# Patient Record
Sex: Male | Born: 2000 | Race: Black or African American | Hispanic: No | Marital: Single | Smoking: Never smoker
Health system: Southern US, Community
[De-identification: ages and names within clinical notes are randomized; demographics above are authoritative.]

---
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
## 2021-05-25 ENCOUNTER — Emergency Department (HOSPITAL_BASED_OUTPATIENT_CLINIC_OR_DEPARTMENT_OTHER)
Admission: EM | Admit: 2021-05-25 | Discharge: 2021-05-25 | Disposition: A | Discharge status: Home / Self Care | Payer: BC Managed Care – PPO | Attending: Emergency Medicine | Admitting: Emergency Medicine

## 2021-05-25 ENCOUNTER — Encounter (HOSPITAL_BASED_OUTPATIENT_CLINIC_OR_DEPARTMENT_OTHER): Payer: Self-pay | Admitting: Emergency Medicine

## 2021-05-25 ENCOUNTER — Emergency Department (HOSPITAL_BASED_OUTPATIENT_CLINIC_OR_DEPARTMENT_OTHER): Payer: BC Managed Care – PPO

## 2021-05-25 DIAGNOSIS — I824Z2 Acute embolism and thrombosis of unspecified deep veins of left distal lower extremity: Secondary | ICD-10-CM | POA: Diagnosis not present

## 2021-05-25 DIAGNOSIS — M7989 Other specified soft tissue disorders: Secondary | ICD-10-CM | POA: Diagnosis not present

## 2021-05-25 DIAGNOSIS — R590 Localized enlarged lymph nodes: Secondary | ICD-10-CM | POA: Diagnosis not present

## 2021-05-25 DIAGNOSIS — R9431 Abnormal electrocardiogram [ECG] [EKG]: Secondary | ICD-10-CM | POA: Diagnosis not present

## 2021-05-25 DIAGNOSIS — I82442 Acute embolism and thrombosis of left tibial vein: Secondary | ICD-10-CM | POA: Diagnosis not present

## 2021-05-25 DIAGNOSIS — I2699 Other pulmonary embolism without acute cor pulmonale: Secondary | ICD-10-CM | POA: Diagnosis not present

## 2021-05-25 DIAGNOSIS — Z8739 Personal history of other diseases of the musculoskeletal system and connective tissue: Secondary | ICD-10-CM | POA: Diagnosis not present

## 2021-05-25 DIAGNOSIS — N62 Hypertrophy of breast: Secondary | ICD-10-CM | POA: Diagnosis not present

## 2021-05-25 DIAGNOSIS — R791 Abnormal coagulation profile: Secondary | ICD-10-CM | POA: Diagnosis not present

## 2021-05-25 DIAGNOSIS — I82402 Acute embolism and thrombosis of unspecified deep veins of left lower extremity: Secondary | ICD-10-CM | POA: Diagnosis not present

## 2021-05-25 LAB — CBC WITH DIFFERENTIAL/PLATELET
Abs Immature Granulocytes: 0.01 10*3/uL (ref 0.00–0.07)
Basophils Absolute: 0 10*3/uL (ref 0.0–0.1)
Basophils Relative: 1 %
Eosinophils Absolute: 0.1 10*3/uL (ref 0.0–0.5)
Eosinophils Relative: 1 %
HCT: 41.3 % (ref 39.0–52.0)
Hemoglobin: 14.3 g/dL (ref 13.0–17.0)
Immature Granulocytes: 0 %
Lymphocytes Relative: 48 %
Lymphs Abs: 2.7 10*3/uL (ref 0.7–4.0)
MCH: 31.4 pg (ref 26.0–34.0)
MCHC: 34.6 g/dL (ref 30.0–36.0)
MCV: 90.6 fL (ref 80.0–100.0)
Monocytes Absolute: 0.5 10*3/uL (ref 0.1–1.0)
Monocytes Relative: 9 %
Neutro Abs: 2.3 10*3/uL (ref 1.7–7.7)
Neutrophils Relative %: 41 %
Platelets: 292 10*3/uL (ref 150–400)
RBC: 4.56 MIL/uL (ref 4.22–5.81)
RDW: 11.6 % (ref 11.5–15.5)
WBC: 5.6 10*3/uL (ref 4.0–10.5)
nRBC: 0 % (ref 0.0–0.2)

## 2021-05-25 LAB — BASIC METABOLIC PANEL
Anion gap: 5 (ref 5–15)
BUN: 9 mg/dL (ref 6–20)
CO2: 26 mmol/L (ref 22–32)
Calcium: 9.1 mg/dL (ref 8.9–10.3)
Chloride: 107 mmol/L (ref 98–111)
Creatinine, Ser: 1.11 mg/dL (ref 0.61–1.24)
GFR, Estimated: >60 mL/min (ref >60)
Glucose, Bld: 92 mg/dL (ref 70–99)
Potassium: 4 mmol/L (ref 3.5–5.1)
Sodium: 138 mmol/L (ref 135–145)

## 2021-05-25 MED ORDER — APIXABAN 2.5 MG PO TABS: 10.0000 mg | ORAL_TABLET | Freq: Two times a day (BID) | ORAL | Status: DC

## 2021-05-25 MED ORDER — APIXABAN (ELIQUIS) VTE STARTER PACK (10MG AND 5MG)
ORAL_TABLET | ORAL | 0 refills | Status: DC
  Filled 2021-05-26: qty 74, 30d supply, fill #0

## 2021-05-25 MED ORDER — APIXABAN 2.5 MG PO TABS: 5.0000 mg | ORAL_TABLET | Freq: Two times a day (BID) | ORAL | Status: DC

## 2021-05-25 MED ORDER — IOHEXOL 350 MG/ML SOLN
75.0000 mL | Freq: Once | INTRAVENOUS | Status: AC | PRN
  Administered 2021-05-25: 75 mL via INTRAVENOUS

## 2021-05-25 MED ORDER — APIXABAN (ELIQUIS) EDUCATION KIT FOR DVT/PE PATIENTS
PACK | Freq: Once | Status: AC
  Administered 2021-05-25: 1

## 2021-05-25 MED ORDER — APIXABAN 2.5 MG PO TABS
10.0000 mg | ORAL_TABLET | Freq: Once | ORAL | Status: AC
  Administered 2021-05-25: 10 mg via ORAL
  Filled 2021-05-25: qty 4

## 2021-05-25 NOTE — ED Provider Notes (Signed)
?[facility] EMERGENCY DEPARTMENT ?Provider Note ? ? ?CSN: [id number] ?Arrival date & time: 05/25/21  1249 ? ?  ? ?History ? ?Chief Complaint  ?Patient presents with  ? Leg Swelling  ? ? ?Norman Nelson is a 21 y.o. male. With no significant past medical history who presents to the emergency department with leg swelling. ? ?States that for around 5 months he has had intermittent left lower extremity swelling.  He states that at most it is from the knee down.  He states that it is worse at the end of the day.  He denies any claudication.  Denies numbness or tingling to the leg.  He denies any rashes or bites or wounds.  He denies ever having injuries to this leg or surgeries.  He is a frequent traveler and the swelling does not correlate to a particular trip.  He denies any shortness of breath, palpitations.  He denies swelling to his right leg.  Denies any hematuria or abdominal swelling. ? ?HPI ? ?  ? ?Home Medications ?Prior to Admission medications   ?Medication Sig Start Date End Date Taking? Authorizing Provider  ?acetaminophen (TYLENOL) 325 MG tablet Take 2 tablets (650 mg total) by mouth every 6 (six) hours as needed. 02/19/17   Jean Rosenthal, NP  ?diphenhydrAMINE (BENADRYL) 25 MG tablet Take 1 tablet (25 mg total) by mouth every 8 (eight) hours as needed (at onset of headache). 02/19/17   Jean Rosenthal, NP  ?ibuprofen (ADVIL,MOTRIN) 800 MG tablet Take 1 tablet (800 mg total) by mouth every 8 (eight) hours as needed for headache. 02/19/17   Jean Rosenthal, NP  ?ondansetron (ZOFRAN ODT) 4 MG disintegrating tablet Take 1 tablet (4 mg total) by mouth every 8 (eight) hours as needed for nausea or vomiting. 02/19/17   Jean Rosenthal, NP  ?   ? ?Allergies    ?Patient has no known allergies.   ? ?Review of Systems   ?Review of Systems  ?Cardiovascular:  Positive for leg swelling.  ?All other systems reviewed and are negative. ? ?Physical Exam ?Updated Vital Signs ?BP 120/62 (BP  Location: Left Arm)   Pulse 69   Temp 97.9 ?F (36.6 ?C) (Oral)   Resp 18   Ht '5\' 9"'  (1.753 m)   Wt 90.7 kg   SpO2 98%   BMI 29.53 kg/m?  ?Physical Exam ?Vitals and nursing note reviewed.  ?Constitutional:   ?   General: He is not in acute distress. ?   Appearance: Normal appearance. He is normal weight. He is not ill-appearing or toxic-appearing.  ?HENT:  ?   Head: Normocephalic and atraumatic.  ?Eyes:  ?   General: No scleral icterus. ?Cardiovascular:  ?   Pulses: Normal pulses.  ?Pulmonary:  ?   Effort: Pulmonary effort is normal. No respiratory distress.  ?Musculoskeletal:     ?   General: Swelling present. No tenderness, deformity or signs of injury. Normal range of motion.  ?   Left lower leg: Edema present.  ?   Comments: Left lower extremity nonpitting edema evident to the mid calf down to the foot.  ?Skin: ?   General: Skin is warm and dry.  ?   Capillary Refill: Capillary refill takes less than 2 seconds.  ?   Findings: No bruising, erythema or rash.  ?Neurological:  ?   General: No focal deficit present.  ?   Mental Status: He is alert and oriented to person, place, and time. Mental status is at  baseline.  ?   Sensory: No sensory deficit.  ?Psychiatric:     ?   Mood and Affect: Mood normal.     ?   Behavior: Behavior normal.     ?   Thought Content: Thought content normal.     ?   Judgment: Judgment normal.  ? ? ?ED Results / Procedures / Treatments   ?Labs ?(all labs ordered are listed, but only abnormal results are displayed) ?Labs Reviewed  ?BASIC METABOLIC PANEL  ?CBC WITH DIFFERENTIAL/PLATELET  ? ?EKG ?None ? ?Radiology ?CT Angio Chest PE W/Cm &/Or Wo Cm ? ?Result Date: 05/25/2021 ?CLINICAL DATA:  Pulmonary embolism (PE) suspected, unknown D-dimer positive DVT EXAM: CT ANGIOGRAPHY CHEST WITH CONTRAST TECHNIQUE: Multidetector CT imaging of the chest was performed using the standard protocol during bolus administration of intravenous contrast. Multiplanar CT image reconstructions and MIPs were  obtained to evaluate the vascular anatomy. RADIATION DOSE REDUCTION: This exam was performed according to the departmental dose-optimization program which includes automated exposure control, adjustment of the mA and/or kV according to patient size and/or use of iterative reconstruction technique. CONTRAST:  74m OMNIPAQUE IOHEXOL 350 MG/ML SOLN COMPARISON:  None Available. FINDINGS: Cardiovascular: There are no filling defects within the pulmonary arteries to suggest pulmonary embolus. Upper normal caliber pulmonary artery at 3 cm. Normal thoracic aorta without dissection or acute aortic findings. Conventional branching pattern from the aortic arch. The heart is normal in size. There is no pericardial effusion. Mediastinum/Nodes: Shotty right hilar lymph node measuring 10 mm short axis. No mediastinal adenopathy. Triangular soft tissue density in the anterior mediastinum is typical of residual thymus, not unexpected for age. No esophageal wall thickening. No thyroid nodule. Lungs/Pleura: Clear lungs. No focal airspace disease. No pleural effusion. No features of pulmonary edema. Trachea and central bronchi are patent. Upper Abdomen: No acute or unexpected findings. Musculoskeletal: There are no acute or suspicious osseous abnormalities. Bilateral gynecomastia. Review of the MIP images confirms the above findings. IMPRESSION: 1. No pulmonary embolus or acute intrathoracic abnormality. 2. Shotty right hilar lymph node, likely reactive. 3. Bilateral gynecomastia. Electronically Signed   By: MKeith RakeM.D.   On: 05/25/2021 17:30  ? ?UK[place]Venous Img Lower  Left (DVT Study) ? ?Result Date: 05/25/2021 ?CLINICAL DATA:  21year old male with a history of leg swelling EXAM: LEFT LOWER EXTREMITY VENOUS DOPPLER ULTRASOUND TECHNIQUE: Gray-scale sonography with graded compression, as well as color Doppler and duplex ultrasound were performed to evaluate the lower extremity deep venous systems from the level of the common  femoral vein and including the common femoral, femoral, profunda femoral, popliteal and calf veins including the posterior tibial, peroneal and gastrocnemius veins when visible. The superficial great saphenous vein was also interrogated. Spectral Doppler was utilized to evaluate flow at rest and with distal augmentation maneuvers in the common femoral, femoral and popliteal veins. COMPARISON:  None Available. FINDINGS: Contralateral Common Femoral Vein: Respiratory phasicity is normal and symmetric with the symptomatic side. No evidence of thrombus. Normal compressibility. Common Femoral Vein: No evidence of thrombus. Normal compressibility, respiratory phasicity and response to augmentation. Saphenofemoral Junction: No evidence of thrombus. Normal compressibility and flow on color Doppler imaging. Profunda Femoral Vein: No evidence of thrombus. Normal compressibility and flow on color Doppler imaging. Femoral Vein: No evidence of thrombus. Normal compressibility, respiratory phasicity and response to augmentation. Popliteal Vein: No evidence of thrombus. Normal compressibility, respiratory phasicity and response to augmentation. Calf Veins: Occlusive thrombus of the posterior tibial vein and peroneal vein which are  noncompressible. No extension to the popliteal vein. Superficial Great Saphenous Vein: No evidence of thrombus. Normal compressibility and flow on color Doppler imaging. Other Findings:  None. IMPRESSION: Directed duplex left lower extremity positive for DVT of the tibial veins involving posterior tibial vein and peroneal vein, with no proximal extension into the popliteal vein. These results were discussed by telephone at the time of interpretation on 05/25/2021 at 3:55 pm with Dr. Theodis Blaze Electronically Signed   By: Corrie Mckusick D.O.   On: 05/25/2021 15:56   ? ?Procedures ?Procedures  ? ?Medications Ordered in ED ?Medications  ?apixaban [facility]) Education Kit for DVT/PE patients (has no  administration in time range)  ?apixaban (ELIQUIS) tablet 10 mg (has no administration in time range)  ?iohexol (OMNIPAQUE) 350 MG/ML injection 75 mL (75 mLs Intravenous Contrast Given 05/25/21 1705)  ? ? ?ED Course/ Medical [facility] Lasso

## 2021-05-25 NOTE — Discharge Instructions (Addendum)
You were seen in the emergency department today for right lower extremity swelling.  You do have a deep vein thrombosis or DVT in your leg.  You do not have a clot in your lungs.  We are starting you on a blood thinner called Eliquis.  You will take this as prescribed on the starter pack.  I have also placed a referral to hematology for you to be seen for an ongoing work-up of why you have this clot in your leg.  Please return to the emergency department if you begin to have shortness of breath, chest pain or difficulty breathing.  Please avoid contact sports.  Please do not take NSAIDs such as ibuprofen, naproxen, diclofenac, meloxicam, Motrin, Advil as these interact with the blood thinner. ? ?Information on my medicine - ELIQUIS? (apixaban) ? ?This medication education was reviewed with me or my healthcare representative as part of my discharge preparation.  ? ?Why was Eliquis? prescribed for you? ?Eliquis? was prescribed to treat blood clots that may have been found in the veins of your legs (deep vein thrombosis) or in your lungs (pulmonary embolism) and to reduce the risk of them occurring again. ? ?What do You need to know about Eliquis? ? ?The starting dose is 10 mg (two 5 mg tablets) taken TWICE daily for the FIRST SEVEN (7) DAYS, then on (enter date)  06/01/2021  the dose is reduced to ONE 5 mg tablet taken TWICE daily.  Eliquis? may be taken with or without food.  ? ?Try to take the dose about the same time in the morning and in the evening. If you have difficulty swallowing the tablet whole please discuss with your pharmacist how to take the medication safely. ? ?Take Eliquis? exactly as prescribed and DO NOT stop taking Eliquis? without talking to the doctor who prescribed the medication.  Stopping may increase your risk of developing a new blood clot.  Refill your prescription before you run out. ? ?After discharge, you should have regular check-up appointments with your healthcare provider that is  prescribing your Eliquis?. ?   ?What do you do if you miss a dose? ?If a dose of ELIQUIS? is not taken at the scheduled time, take it as soon as possible on the same day and twice-daily administration should be resumed. The dose should not be doubled to make up for a missed dose. ? ?Important Safety Information ?A possible side effect of Eliquis? is bleeding. You should call your healthcare provider right away if you experience any of the following: ?Bleeding from an injury or your nose that does not stop. ?Unusual colored urine (red or dark brown) or unusual colored stools (red or black). ?Unusual bruising for unknown reasons. ?A serious fall or if you hit your head (even if there is no bleeding). ? ?Some medicines may interact with Eliquis? and might increase your risk of bleeding or clotting while on Eliquis?Marland Kitchen To help avoid this, consult your healthcare provider or pharmacist prior to using any new prescription or non-prescription medications, including herbals, vitamins, non-steroidal anti-inflammatory drugs (NSAIDs) and supplements. ? ?This website has more information on Eliquis? (apixaban): http://www.eliquis.com/eliquis/home  ?

## 2021-05-25 NOTE — ED Notes (Signed)
Pt A&Ox4 ambulatory at d/c with independent steady gait. Pt verbalized understanding of d/c instructions, prescription and follow up care. 

## 2021-05-25 NOTE — ED Triage Notes (Signed)
Pt reports LLE swelling (knee down) x couple months; no injury; denies pain ?

## 2021-05-25 NOTE — Progress Notes (Signed)
ANTICOAGULATION CONSULT NOTE - Initial Consult ? ?Pharmacy Consult for apixaban ?Indication: DVT ? ?No Known Allergies ? ?Patient Measurements: ?Height: 5\' 9"  (175.3 cm) ?Weight: 90.7 kg (200 lb) ?IBW/kg (Calculated) : 70.7 ?Heparin Dosing Weight: N/A ? ?Vital Signs: ?Temp: 97.9 ?F (36.6 ?C) (05/10 1255) ?Temp Source: Oral (05/10 1255) ?BP: 118/52 (05/10 1700) ?Pulse Rate: 62 (05/10 1700) ? ?Labs: ?Recent Labs  ?  05/25/21 ?1605  ?HGB 14.3  ?HCT 41.3  ?PLT 292  ?CREATININE 1.11  ? ? ?Estimated Creatinine Clearance: 118.2 mL/min (by C-G formula based on SCr of 1.11 mg/dL). ? ? ?Medical History: ?History reviewed. No pertinent past medical history. ? ?Medications:  ?Scheduled:  ? apixaban   Does not apply Once  ? apixaban  10 mg Oral BID  ? Followed by  ? [START ON 06/01/2021] apixaban  5 mg Oral BID  ? ? ?Assessment: ?21 yo M with no significant PMH who was found to have a LLE DVT. Pharmacy consulted to dose apixaban. CBC wnl.  ? ?Goal of Therapy:  ?Monitor platelets by anticoagulation protocol: Yes ?  ?Plan:  ?Apixaban 10 mg BID x 7 days then 5 mg BID ?F/u inciting events and need for hypercoag w/u ? ?26 A Daisia Slomski ?05/25/2021,5:54 PM ? ? ?

## 2021-05-26 ENCOUNTER — Other Ambulatory Visit (HOSPITAL_BASED_OUTPATIENT_CLINIC_OR_DEPARTMENT_OTHER): Payer: Self-pay

## 2021-05-31 ENCOUNTER — Other Ambulatory Visit: Payer: Self-pay | Admitting: Family

## 2021-05-31 DIAGNOSIS — I824Y9 Acute embolism and thrombosis of unspecified deep veins of unspecified proximal lower extremity: Secondary | ICD-10-CM

## 2021-05-31 DIAGNOSIS — D6859 Other primary thrombophilia: Secondary | ICD-10-CM

## 2021-06-01 ENCOUNTER — Inpatient Hospital Stay (HOSPITAL_BASED_OUTPATIENT_CLINIC_OR_DEPARTMENT_OTHER): Payer: BC Managed Care – PPO | Admitting: Family

## 2021-06-01 ENCOUNTER — Encounter: Payer: Self-pay | Admitting: Family

## 2021-06-01 ENCOUNTER — Inpatient Hospital Stay: Payer: BC Managed Care – PPO | Attending: Family

## 2021-06-01 VITALS — BP 118/60 | HR 62 | Temp 98.4°F | Resp 17 | Ht 69.0 in | Wt 204.1 lb

## 2021-06-01 DIAGNOSIS — I82442 Acute embolism and thrombosis of left tibial vein: Secondary | ICD-10-CM

## 2021-06-01 DIAGNOSIS — I824Y9 Acute embolism and thrombosis of unspecified deep veins of unspecified proximal lower extremity: Secondary | ICD-10-CM

## 2021-06-01 DIAGNOSIS — I82452 Acute embolism and thrombosis of left peroneal vein: Secondary | ICD-10-CM | POA: Diagnosis not present

## 2021-06-01 DIAGNOSIS — Z7901 Long term (current) use of anticoagulants: Secondary | ICD-10-CM | POA: Diagnosis not present

## 2021-06-01 DIAGNOSIS — D6859 Other primary thrombophilia: Secondary | ICD-10-CM

## 2021-06-01 LAB — CBC WITH DIFFERENTIAL (CANCER CENTER ONLY)
Abs Immature Granulocytes: 0.03 10*3/uL (ref 0.00–0.07)
Basophils Absolute: 0.1 10*3/uL (ref 0.0–0.1)
Basophils Relative: 1 %
Eosinophils Absolute: 0 10*3/uL (ref 0.0–0.5)
Eosinophils Relative: 1 %
HCT: 41 % (ref 39.0–52.0)
Hemoglobin: 14 g/dL (ref 13.0–17.0)
Immature Granulocytes: 1 %
Lymphocytes Relative: 43 %
Lymphs Abs: 2.2 10*3/uL (ref 0.7–4.0)
MCH: 31 pg (ref 26.0–34.0)
MCHC: 34.1 g/dL (ref 30.0–36.0)
MCV: 90.7 fL (ref 80.0–100.0)
Monocytes Absolute: 0.3 10*3/uL (ref 0.1–1.0)
Monocytes Relative: 7 %
Neutro Abs: 2.5 10*3/uL (ref 1.7–7.7)
Neutrophils Relative %: 47 %
Platelet Count: 286 10*3/uL (ref 150–400)
RBC: 4.52 MIL/uL (ref 4.22–5.81)
RDW: 11.3 % — ABNORMAL LOW (ref 11.5–15.5)
WBC Count: 5.1 10*3/uL (ref 4.0–10.5)
nRBC: 0 % (ref 0.0–0.2)

## 2021-06-01 LAB — CMP (CANCER CENTER ONLY)
ALT: 39 U/L (ref 0–44)
AST: 49 U/L — ABNORMAL HIGH (ref 15–41)
Albumin: 4.3 g/dL (ref 3.5–5.0)
Alkaline Phosphatase: 53 U/L (ref 38–126)
Anion gap: 6 (ref 5–15)
BUN: 9 mg/dL (ref 6–20)
CO2: 26 mmol/L (ref 22–32)
Calcium: 9.6 mg/dL (ref 8.9–10.3)
Chloride: 105 mmol/L (ref 98–111)
Creatinine: 1 mg/dL (ref 0.61–1.24)
GFR, Estimated: >60 mL/min (ref >60)
Glucose, Bld: 93 mg/dL (ref 70–99)
Potassium: 4 mmol/L (ref 3.5–5.1)
Sodium: 137 mmol/L (ref 135–145)
Total Bilirubin: 0.6 mg/dL (ref 0.3–1.2)
Total Protein: 7.4 g/dL (ref 6.5–8.1)

## 2021-06-01 LAB — LACTATE DEHYDROGENASE: LDH: 203 U/L — ABNORMAL HIGH (ref 98–192)

## 2021-06-01 LAB — D-DIMER, QUANTITATIVE: D-Dimer, Quant: <0.27 ug/mL-FEU (ref 0.00–0.50)

## 2021-06-01 NOTE — Progress Notes (Signed)
Hematology/Oncology Consultation  ? ?Name: Norman Nelson      MRN: [id number]    Location: Room/bed info not found  Date: 06/01/2021 Time:3:07 PM ? ? ?REFERRING PHYSICIAN:  Mertha Baars, PA-C ? ?REASON FOR CONSULT: left lower extremity DVT ?  ?DIAGNOSIS: Left lower extremity DVT ? ?HISTORY OF PRESENT ILLNESS:  Mr. Norman Nelson is a very pleasant 21 yo gentleman with recent diagnosis diagnosis or left lower extremity DVT involving the posterior tibial vein and peroneal vein. His CTA was negative for PE. He has not prior history of thrombotic event.  ?No known familial history of thrombus.  ?His maternal grandfather had history of stroke.  ?The patient had developed swelling in the left lower leg over 5 months leading up to his ED visit and diagnosis.  ?He states that he had been using a pre workout and energy drinks but no testosterone/hormone supplementation.  ?He had also been travelling quite a bit around that time including trips to [place], [place], [place] and [place].  ?No known injury.  ?So far, he is tolerating Eliquis nicely. No bleeding, bruising or petechiae.  ?He notes that the swelling in his left leg is improving but he has some tingling in his feet when wearing tight shoes.  ?No surgical history.  ?No personal or familial history of cancer.  ?No history of diabetes or thyroid disease.  ?No fever, chills, n/v, cough, rash, dizziness, SOB, chest pain, palpitations, abdominal pain or changes in bowel or bladder habits.  ?No falls or syncope to report.  ?No smoking, ETOH or recreational drug use.  ?Appetite and hydration are good.  ?Prior to developing the blood clot he was going to the gym 7 days a week.  ? ?ROS: All other 10 point review of systems is negative.  ? ?PAST MEDICAL HISTORY:   ?No past medical history on file. ? ?ALLERGIES: ?No Known Allergies ?   ?MEDICATIONS:  ?Current Outpatient Medications on File Prior to Visit  ?Medication Sig Dispense Refill  ? acetaminophen (TYLENOL) 325 MG  tablet Take 2 tablets (650 mg total) by mouth every 6 (six) hours as needed. 30 tablet 0  ? APIXABAN (ELIQUIS) VTE STARTER PACK (10MG  AND 5MG ) Take as directed on package: start with two-5mg  tablets twice daily for 7 days. On day 8, switch to one-5mg  tablet twice daily. 74 each 0  ? diphenhydrAMINE (BENADRYL) 25 MG tablet Take 1 tablet (25 mg total) by mouth every 8 (eight) hours as needed (at onset of headache). 20 tablet 0  ? ibuprofen (ADVIL,MOTRIN) 800 MG tablet Take 1 tablet (800 mg total) by mouth every 8 (eight) hours as needed for headache. 21 tablet 0  ? ondansetron (ZOFRAN ODT) 4 MG disintegrating tablet Take 1 tablet (4 mg total) by mouth every 8 (eight) hours as needed for nausea or vomiting. 20 tablet 0  ? ?No current facility-administered medications on file prior to visit.  ? ?  ?PAST SURGICAL HISTORY ?No past surgical history on file. ? ?FAMILY HISTORY: ?No family history on file. ? ?SOCIAL HISTORY: ? reports that he has never smoked. He has never used smokeless tobacco. He reports that he does not currently use alcohol. He reports that he does not currently use drugs. ? ?PERFORMANCE STATUS: ?The patient's performance status is 1 - Symptomatic but completely ambulatory ? ?PHYSICAL EXAM: ?Most Recent Vital Signs: Blood pressure 118/60, pulse 62, temperature 98.4 ?F (36.9 ?C), temperature source Oral, resp. rate 17, height 5\' 9"  (1.753 m), weight 204 lb 1.9 oz (92.6 kg), SpO2  100 %. ?BP 118/60 (BP Location: Right Arm, Patient Position: Sitting)   Pulse 62   Temp 98.4 ?F (36.9 ?C) (Oral)   Resp 17   Ht 5\' 9"  (1.753 m)   Wt 204 lb 1.9 oz (92.6 kg)   SpO2 100%   BMI 30.14 kg/m?  ? ?General Appearance:    Alert, cooperative, no distress, appears stated age  ?Head:    Normocephalic, without obvious abnormality, atraumatic  ?Eyes:    PERRL, conjunctiva/corneas clear, EOM's intact, fundi  ?  benign, both eyes       ?   ?   ?Throat:   Lips, mucosa, and tongue normal; teeth and gums normal  ?Neck:    Supple, symmetrical, trachea midline, no adenopathy;     ?  thyroid:  No enlargement/tenderness/nodules; no carotid ?  bruit or JVD  ?Back:     Symmetric, no curvature, ROM normal, no CVA tenderness  ?Lungs:     Clear to auscultation bilaterally, respirations unlabored  ?Chest wall:    No tenderness or deformity  ?Heart:    Regular rate and rhythm, S1 and S2 normal, no murmur, rub   or gallop  ?Abdomen:     Soft, non-tender, bowel sounds active all four quadrants,  ?  no masses, no organomegaly  ?   ?   ?Extremities:   Extremities normal, atraumatic, no cyanosis or edema  ?Pulses:   2+ and symmetric all extremities  ?Skin:   Skin color, texture, turgor normal, no rashes or lesions  ?Lymph nodes:   Cervical, supraclavicular, and axillary nodes normal  ?Neurologic:   CNII-XII intact. Normal strength, sensation and reflexes    ?  throughout  ? ? ?LABORATORY DATA:  ?Results for orders placed or performed in visit on 06/01/21 (from the past 48 hour(s))  ?CBC with Differential (Cancer Center Only)     Status: Abnormal  ? Collection Time: 06/01/21  2:39 PM  ?Result Value Ref Range  ? WBC Count 5.1 4.0 - 10.5 K/uL  ? RBC 4.52 4.22 - 5.81 MIL/uL  ? Hemoglobin 14.0 13.0 - 17.0 g/dL  ? HCT 41.0 39.0 - 52.0 %  ? MCV 90.7 80.0 - 100.0 fL  ? MCH 31.0 26.0 - 34.0 pg  ? MCHC 34.1 30.0 - 36.0 g/dL  ? RDW 11.3 (L) 11.5 - 15.5 %  ? Platelet Count 286 150 - 400 K/uL  ? nRBC 0.0 0.0 - 0.2 %  ? Neutrophils Relative % 47 %  ? Neutro Abs 2.5 1.7 - 7.7 K/uL  ? Lymphocytes Relative 43 %  ? Lymphs Abs 2.2 0.7 - 4.0 K/uL  ? Monocytes Relative 7 %  ? Monocytes Absolute 0.3 0.1 - 1.0 K/uL  ? Eosinophils Relative 1 %  ? Eosinophils Absolute 0.0 0.0 - 0.5 K/uL  ? Basophils Relative 1 %  ? Basophils Absolute 0.1 0.0 - 0.1 K/uL  ? Immature Granulocytes 1 %  ? Abs Immature Granulocytes 0.03 0.00 - 0.07 K/uL  ?  Comment: Performed at [facility] [place], [address], [place], [place] [id number]  ?    ? ?RADIOGRAPHY: ?No results found.   ?  ?PATHOLOGY: None  ? ?ASSESSMENT/PLAN: Mr. Candida PeelingOwusu-Agyemang is a very pleasant 21 yo gentleman with recent diagnosis diagnosis or left lower extremity DVT involving the posterior tibial vein and peroneal vein. ?Hyper coag panel is pending. D-dimer is < 0.27.  ?Eliquis refilled for 5 mg PO BID.  ?We will plan  to see him again in August and repeat US that same day.  ? ?All questions were answered. The patient knows to call the clinic with any problems, questions or concerns. We can certainly see the patient much sooner if necessary. ? ?The patient was discussed with Dr. Myna Hidalgo and he is in agreement with the aforementioned.  ? ?Eileen Stanford, NP ?  ? ? ? ?  ?

## 2021-06-02 LAB — PROTEIN C, TOTAL: Protein C, Total: 91 % (ref 60–150)

## 2021-06-02 LAB — BETA-2-GLYCOPROTEIN I ABS, IGG/M/A
Beta-2 Glyco I IgG: <9 GPI IgG units (ref 0–20)
Beta-2-Glycoprotein I IgA: <9 GPI IgA units (ref 0–25)
Beta-2-Glycoprotein I IgM: <9 GPI IgM units (ref 0–32)

## 2021-06-02 LAB — HOMOCYSTEINE: Homocysteine: 6.8 umol/L (ref 0.0–14.5)

## 2021-06-03 ENCOUNTER — Other Ambulatory Visit: Payer: Self-pay | Admitting: Family

## 2021-06-03 ENCOUNTER — Ambulatory Visit (HOSPITAL_BASED_OUTPATIENT_CLINIC_OR_DEPARTMENT_OTHER): Payer: BC Managed Care – PPO

## 2021-06-03 ENCOUNTER — Other Ambulatory Visit (HOSPITAL_BASED_OUTPATIENT_CLINIC_OR_DEPARTMENT_OTHER): Payer: Self-pay

## 2021-06-03 DIAGNOSIS — D6859 Other primary thrombophilia: Secondary | ICD-10-CM

## 2021-06-03 DIAGNOSIS — I824Y9 Acute embolism and thrombosis of unspecified deep veins of unspecified proximal lower extremity: Secondary | ICD-10-CM

## 2021-06-03 LAB — DRVVT MIX: dRVVT Mix: 57.8 s — ABNORMAL HIGH (ref 0.0–40.4)

## 2021-06-03 LAB — CARDIOLIPIN ANTIBODIES, IGG, IGM, IGA
Anticardiolipin IgA: <9 APL U/mL (ref 0–11)
Anticardiolipin IgG: <9 GPL U/mL (ref 0–14)
Anticardiolipin IgM: <9 MPL U/mL (ref 0–12)

## 2021-06-03 LAB — LUPUS ANTICOAGULANT PANEL
DRVVT: 85.2 s — ABNORMAL HIGH (ref 0.0–47.0)
PTT Lupus Anticoagulant: 47.5 s — ABNORMAL HIGH (ref 0.0–43.5)

## 2021-06-03 LAB — PROTEIN C ACTIVITY: Protein C Activity: 117 % (ref 73–180)

## 2021-06-03 LAB — PROTEIN S, TOTAL: Protein S Ag, Total: 73 % (ref 60–150)

## 2021-06-03 LAB — PROTEIN S ACTIVITY: Protein S Activity: 101 % (ref 63–140)

## 2021-06-03 LAB — DRVVT CONFIRM: dRVVT Confirm: 1.4 ratio — ABNORMAL HIGH (ref 0.8–1.2)

## 2021-06-03 LAB — PTT-LA MIX: PTT-LA Mix: 42 s — ABNORMAL HIGH (ref 0.0–40.5)

## 2021-06-03 LAB — HEXAGONAL PHASE PHOSPHOLIPID: Hexagonal Phase Phospholipid: 9 s (ref 0–11)

## 2021-06-03 MED ORDER — APIXABAN 5 MG PO TABS
5.0000 mg | ORAL_TABLET | Freq: Two times a day (BID) | ORAL | 3 refills | Status: DC
  Filled 2021-06-03 – 2021-06-22 (×3): qty 60, 30d supply, fill #0

## 2021-06-06 LAB — FACTOR 5 LEIDEN

## 2021-06-06 LAB — ANTITHROMBIN III ANTIGEN: AT III AG PPP IMM-ACNC: 99 % (ref 72–124)

## 2021-06-06 LAB — PROTHROMBIN GENE MUTATION

## 2021-06-08 ENCOUNTER — Telehealth: Payer: Self-pay | Admitting: Family

## 2021-06-08 DIAGNOSIS — I824Y9 Acute embolism and thrombosis of unspecified deep veins of unspecified proximal lower extremity: Secondary | ICD-10-CM

## 2021-06-08 DIAGNOSIS — R760 Raised antibody titer: Secondary | ICD-10-CM

## 2021-06-08 NOTE — Telephone Encounter (Signed)
I was able to speak with the patient and go over recent hyper coag lab results. He was positive for the lupus anticoagulant but panel was otherwise negative. We will recheck this at his follow-up. No questions or concerns at this time. Patient appreciative of call.

## 2021-06-22 ENCOUNTER — Other Ambulatory Visit (HOSPITAL_BASED_OUTPATIENT_CLINIC_OR_DEPARTMENT_OTHER): Payer: Self-pay

## 2021-06-22 ENCOUNTER — Encounter (HOSPITAL_COMMUNITY): Payer: Self-pay

## 2021-06-22 NOTE — Progress Notes (Signed)
PA for Eliquis 5 mg submitted to [employer] via CoverMyMeds.  PA approved from 05/23/21-06/22/22.

## 2021-06-28 ENCOUNTER — Ambulatory Visit: Payer: BC Managed Care – PPO | Admitting: Emergency Medicine

## 2021-06-28 ENCOUNTER — Encounter: Payer: Self-pay | Admitting: Emergency Medicine

## 2021-06-28 VITALS — BP 108/72 | HR 65 | Temp 98.4°F | Ht 68.0 in | Wt 210.1 lb

## 2021-06-28 DIAGNOSIS — Z13220 Encounter for screening for lipoid disorders: Secondary | ICD-10-CM

## 2021-06-28 DIAGNOSIS — Z13228 Encounter for screening for other metabolic disorders: Secondary | ICD-10-CM

## 2021-06-28 DIAGNOSIS — Z0000 Encounter for general adult medical examination without abnormal findings: Secondary | ICD-10-CM

## 2021-06-28 DIAGNOSIS — Z1159 Encounter for screening for other viral diseases: Secondary | ICD-10-CM

## 2021-06-28 DIAGNOSIS — Z7901 Long term (current) use of anticoagulants: Secondary | ICD-10-CM

## 2021-06-28 DIAGNOSIS — Z130 Encounter for screening for diseases of the blood and blood-forming organs and certain disorders involving the immune mechanism: Secondary | ICD-10-CM

## 2021-06-28 DIAGNOSIS — Z1329 Encounter for screening for other suspected endocrine disorder: Secondary | ICD-10-CM

## 2021-06-28 DIAGNOSIS — Z86718 Personal history of other venous thrombosis and embolism: Secondary | ICD-10-CM

## 2021-06-28 DIAGNOSIS — Z114 Encounter for screening for human immunodeficiency virus [HIV]: Secondary | ICD-10-CM

## 2021-06-28 DIAGNOSIS — Z0001 Encounter for general adult medical examination with abnormal findings: Secondary | ICD-10-CM

## 2021-06-28 LAB — LIPID PANEL
Cholesterol: 164 mg/dL (ref 0–200)
HDL: 44.8 mg/dL (ref >39.00)
LDL Cholesterol: 105 mg/dL — ABNORMAL HIGH (ref 0–99)
NonHDL: 119.18
Total CHOL/HDL Ratio: 4
Triglycerides: 71 mg/dL (ref 0.0–149.0)
VLDL: 14.2 mg/dL (ref 0.0–40.0)

## 2021-06-28 LAB — CBC WITH DIFFERENTIAL/PLATELET
Basophils Absolute: 0 10*3/uL (ref 0.0–0.1)
Basophils Relative: 0.3 % (ref 0.0–3.0)
Eosinophils Absolute: 0 10*3/uL (ref 0.0–0.7)
Eosinophils Relative: 0.6 % (ref 0.0–5.0)
HCT: 44.3 % (ref 39.0–52.0)
Hemoglobin: 14.5 g/dL (ref 13.0–17.0)
Lymphocytes Relative: 46.2 % — ABNORMAL HIGH (ref 12.0–46.0)
Lymphs Abs: 2.3 10*3/uL (ref 0.7–4.0)
MCHC: 32.6 g/dL (ref 30.0–36.0)
MCV: 94 fl (ref 78.0–100.0)
Monocytes Absolute: 0.4 10*3/uL (ref 0.1–1.0)
Monocytes Relative: 8.4 % (ref 3.0–12.0)
Neutro Abs: 2.2 10*3/uL (ref 1.4–7.7)
Neutrophils Relative %: 44.5 % (ref 43.0–77.0)
Platelets: 279 10*3/uL (ref 150.0–400.0)
RBC: 4.72 Mil/uL (ref 4.22–5.81)
RDW: 12 % (ref 11.5–15.5)
WBC: 4.9 10*3/uL (ref 4.0–10.5)

## 2021-06-28 LAB — COMPREHENSIVE METABOLIC PANEL
ALT: 10 U/L (ref 0–53)
AST: 14 U/L (ref 0–37)
Albumin: 4.3 g/dL (ref 3.5–5.2)
Alkaline Phosphatase: 63 U/L (ref 39–117)
BUN: 8 mg/dL (ref 6–23)
CO2: 28 mEq/L (ref 19–32)
Calcium: 9.7 mg/dL (ref 8.4–10.5)
Chloride: 102 mEq/L (ref 96–112)
Creatinine, Ser: 1.1 mg/dL (ref 0.40–1.50)
GFR: 96.3 mL/min (ref >60.00)
Glucose, Bld: 92 mg/dL (ref 70–99)
Potassium: 4.2 mEq/L (ref 3.5–5.1)
Sodium: 136 mEq/L (ref 135–145)
Total Bilirubin: 0.7 mg/dL (ref 0.2–1.2)
Total Protein: 7.4 g/dL (ref 6.0–8.3)

## 2021-06-28 LAB — HEMOGLOBIN A1C: Hgb A1c MFr Bld: 4.9 % (ref 4.6–6.5)

## 2021-06-28 NOTE — Progress Notes (Signed)
Norman Nelson 21 y.o.   Chief Complaint  Patient presents with   New Patient (Initial Visit)    No concerns    HISTORY OF PRESENT ILLNESS: This is a 21 y.o. male first visit to this office, here to establish care with me. No chronic medical problems.  Healthy lifestyle.  Non-smoker. Recently diagnosed with DVT left lower extremity.  Presently on Eliquis 5 mg twice a day.  Tolerating it well.  No complications.  Told he has a positive lupus anticoagulant. Most recent hematology office visit note assessment and plan as follows: ASSESSMENT/PLAN: Mr. Hepp is a very pleasant 21 yo gentleman with recent diagnosis diagnosis or left lower extremity DVT involving the posterior tibial vein and peroneal vein. Hyper coag panel is pending. D-dimer is < 0.27.  Eliquis refilled for 5 mg PO BID.  We will plan to see him again in August and repeat US that same day.    All questions were answered. The patient knows to call the clinic with any problems, questions or concerns. We can certainly see the patient much sooner if necessary.   The patient was discussed with Dr. Myna Hidalgo and he is in agreement with the aforementioned.    Eileen Stanford, NP  HPI   Prior to Admission medications   Medication Sig Start Date End Date Taking? Authorizing Provider  apixaban (ELIQUIS) 5 MG TABS tablet Take 1 tablet (5 mg total) by mouth 2 (two) times daily. 06/03/21  Yes Erenest Blank, NP  acetaminophen (TYLENOL) 325 MG tablet Take 2 tablets (650 mg total) by mouth every 6 (six) hours as needed. Patient not taking: Reported on 06/28/2021 02/19/17   Sherrilee Gilles, NP  diphenhydrAMINE (BENADRYL) 25 MG tablet Take 1 tablet (25 mg total) by mouth every 8 (eight) hours as needed (at onset of headache). Patient not taking: Reported on 06/28/2021 02/19/17   Sherrilee Gilles, NP  ibuprofen (ADVIL,MOTRIN) 800 MG tablet Take 1 tablet (800 mg total) by mouth every 8 (eight) hours as needed for  headache. Patient not taking: Reported on 06/28/2021 02/19/17   Sherrilee Gilles, NP  ondansetron (ZOFRAN ODT) 4 MG disintegrating tablet Take 1 tablet (4 mg total) by mouth every 8 (eight) hours as needed for nausea or vomiting. Patient not taking: Reported on 06/28/2021 02/19/17   Sherrilee Gilles, NP    No Known Allergies  There are no problems to display for this patient.   No past medical history on file.  No past surgical history on file.  Social History   Socioeconomic History   Marital status: Single    Spouse name: Not on file   Number of children: Not on file   Years of education: Not on file   Highest education level: Not on file  Occupational History   Not on file  Tobacco Use   Smoking status: Never   Smokeless tobacco: Never  Substance and Sexual Activity   Alcohol use: Not Currently   Drug use: Not Currently   Sexual activity: Not on file  Other Topics Concern   Not on file  Social History Narrative   Not on file   Social Determinants of Health   Financial Resource Strain: Not on file  Food Insecurity: Not on file  Transportation Needs: Not on file  Physical Activity: Not on file  Stress: Not on file  Social Connections: Not on file  Intimate Partner Violence: Not on file    No family history on file.   Review  of Systems  Constitutional: Negative.  Negative for chills and fever.  HENT: Negative.  Negative for congestion, nosebleeds and sore throat.   Respiratory: Negative.  Negative for cough, hemoptysis and shortness of breath.   Cardiovascular: Negative.  Negative for chest pain and palpitations.  Gastrointestinal: Negative.  Negative for abdominal pain, blood in stool, diarrhea, nausea and vomiting.  Genitourinary:  Negative for dysuria and hematuria.  Skin: Negative.  Negative for rash.  Neurological: Negative.  Negative for dizziness and headaches.  All other systems reviewed and are negative.  Today's Vitals   06/28/21 1011  BP:  108/72  Pulse: 65  Temp: 98.4 F (36.9 C)  TempSrc: Oral  SpO2: 98%  Weight: 210 lb 2 oz (95.3 kg)  Height: 5\' 8"  (1.727 m)   Body mass index is 31.95 kg/m.   Physical Exam Vitals reviewed.  Constitutional:      Appearance: Normal appearance.  HENT:     Head: Normocephalic.     Right Ear: Tympanic membrane, ear canal and external ear normal.     Left Ear: Tympanic membrane, ear canal and external ear normal.     Mouth/Throat:     Mouth: Mucous membranes are moist.     Pharynx: Oropharynx is clear.  Eyes:     Extraocular Movements: Extraocular movements intact.     Conjunctiva/sclera: Conjunctivae normal.     Pupils: Pupils are equal, round, and reactive to light.  Cardiovascular:     Rate and Rhythm: Normal rate and regular rhythm.     Pulses: Normal pulses.     Heart sounds: Normal heart sounds.  Pulmonary:     Effort: Pulmonary effort is normal.     Breath sounds: Normal breath sounds.  Abdominal:     Palpations: Abdomen is soft. There is no mass.     Tenderness: There is no abdominal tenderness.  Musculoskeletal:     Cervical back: No tenderness.     Right lower leg: No edema.     Left lower leg: No edema.  Lymphadenopathy:     Cervical: No cervical adenopathy.  Skin:    General: Skin is warm and dry.     Capillary Refill: Capillary refill takes less than 2 seconds.  Neurological:     General: No focal deficit present.     Mental Status: He is alert and oriented to person, place, and time.  Psychiatric:        Mood and Affect: Mood normal.        Behavior: Behavior normal.      ASSESSMENT & PLAN: Problem List Items Addressed This Visit   None Visit Diagnoses     Encounter for general adult medical examination with abnormal findings    -  Primary   Need for hepatitis C screening test       Relevant Orders   Hepatitis C antibody screen   Screening for HIV (human immunodeficiency virus)       Relevant Orders   HIV antibody   Screening for  deficiency anemia       Relevant Orders   CBC with Differential   Screening for lipoid disorders       Relevant Orders   Lipid panel   Screening for endocrine, metabolic and immunity disorder       Relevant Orders   Comprehensive metabolic panel   Hemoglobin A1c   History of DVT of lower extremity       Current use of long term anticoagulation  Modifiable risk factors discussed with patient. Anticipatory guidance according to age provided. The following topics were also discussed: Social Determinants of Health Smoking.  Non-smoker Diet and nutrition Benefits of exercise Cancer family history review Vaccinations recommendations Cardiovascular risk assessment Recent diagnosis of DVT and management with long-term anticoagulation Mental health including depression and anxiety Fall and accident prevention  Patient Instructions  Health Maintenance, Male Adopting a healthy lifestyle and getting preventive care are important in promoting health and wellness. Ask your health care provider about: The right schedule for you to have regular tests and exams. Things you can do on your own to prevent diseases and keep yourself healthy. What should I know about diet, weight, and exercise? Eat a healthy diet  Eat a diet that includes plenty of vegetables, fruits, low-fat dairy products, and lean protein. Do not eat a lot of foods that are high in solid fats, added sugars, or sodium. Maintain a healthy weight Body mass index (BMI) is a measurement that can be used to identify possible weight problems. It estimates body fat based on height and weight. Your health care provider can help determine your BMI and help you achieve or maintain a healthy weight. Get regular exercise Get regular exercise. This is one of the most important things you can do for your health. Most adults should: Exercise for at least 150 minutes each week. The exercise should increase your heart rate and make you  sweat (moderate-intensity exercise). Do strengthening exercises at least twice a week. This is in addition to the moderate-intensity exercise. Spend less time sitting. Even light physical activity can be beneficial. Watch cholesterol and blood lipids Have your blood tested for lipids and cholesterol at 21 years of age, then have this test every 5 years. You may need to have your cholesterol levels checked more often if: Your lipid or cholesterol levels are high. You are older than 21 years of age. You are at high risk for heart disease. What should I know about cancer screening? Many types of cancers can be detected early and may often be prevented. Depending on your health history and family history, you may need to have cancer screening at various ages. This may include screening for: Colorectal cancer. Prostate cancer. Skin cancer. Lung cancer. What should I know about heart disease, diabetes, and high blood pressure? Blood pressure and heart disease High blood pressure causes heart disease and increases the risk of stroke. This is more likely to develop in people who have high blood pressure readings or are overweight. Talk with your health care provider about your target blood pressure readings. Have your blood pressure checked: Every 3-5 years if you are 21-5 years of age. Every year if you are 84 years old or older. If you are between the ages of 60 and 74 and are a current or former smoker, ask your health care provider if you should have a one-time screening for abdominal aortic aneurysm (AAA). Diabetes Have regular diabetes screenings. This checks your fasting blood sugar level. Have the screening done: Once every three years after age 55 if you are at a normal weight and have a low risk for diabetes. More often and at a younger age if you are overweight or have a high risk for diabetes. What should I know about preventing infection? Hepatitis B If you have a higher risk for  hepatitis B, you should be screened for this virus. Talk with your health care provider to find out if you are at risk  for hepatitis B infection. Hepatitis C Blood testing is recommended for: Everyone born from 1945 through 1965. Anyone with known ris61k factors for hepatitis C. Sexually transmitted infections (STIs) You should be screened each year for STIs, including gonorrhea and chlamydia, if: You are sexually active and are younger than 21 years of age. You are older than 21 years of age and your health care provider tells you that you are at risk for this type of infection. Your sexual activity has changed since you were last screened, and you are at increased risk for chlamydia or gonorrhea. Ask your health care provider if you are at risk. Ask your health care provider about whether you are at high risk for HIV. Your health care provider may recommend a prescription medicine to help prevent HIV infection. If you choose to take medicine to prevent HIV, you should first get tested for HIV. You should then be tested every 3 months for as long as you are taking the medicine. Follow these instructions at home: Alcohol use Do not drink alcohol if your health care provider tells you not to drink. If you drink alcohol: Limit how much you have to 0-2 drinks a day. Know how much alcohol is in your drink. In the [place] one drink equals one 12 oz bottle of beer (355 mL), one 5 oz glass of wine (148 mL), or one 1 oz glass of hard liquor (44 mL). Lifestyle Do not use any products that contain nicotine or tobacco. These products include cigarettes, chewing tobacco, and vaping devices, such as e-cigarettes. If you need help quitting, ask your health care provider. Do not use street drugs. Do not share needles. Ask your health care provider for help if you need support or information about quitting drugs. General instructions Schedule regular health, dental, and eye exams. Stay current with your  vaccines. Tell your health care provider if: You often feel depressed. You have ever been abused or do not feel safe at home. Summary Adopting a healthy lifestyle and getting preventive care are important in promoting health and wellness. Follow your health care provider's instructions about healthy diet, exercising, and getting tested or screened for diseases. Follow your health care provider's instructions on monitoring your cholesterol and blood pressure. This information is not intended to replace advice given to you by your health care provider. Make sure you discuss any questions you have with your health care provider. Document Revised: 05/24/2020 Document Reviewed: 05/24/2020 Elsevier Patient Education  2023 [employer]     Edwina BarthMiguel Debanhi Blaker, MD [facility]

## 2021-06-28 NOTE — Patient Instructions (Signed)
Health Maintenance, Male Adopting a healthy lifestyle and getting preventive care are important in promoting health and wellness. Ask your health care provider about: The right schedule for you to have regular tests and exams. Things you can do on your own to prevent diseases and keep yourself healthy. What should I know about diet, weight, and exercise? Eat a healthy diet  Eat a diet that includes plenty of vegetables, fruits, low-fat dairy products, and lean protein. Do not eat a lot of foods that are high in solid fats, added sugars, or sodium. Maintain a healthy weight Body mass index (BMI) is a measurement that can be used to identify possible weight problems. It estimates body fat based on height and weight. Your health care provider can help determine your BMI and help you achieve or maintain a healthy weight. Get regular exercise Get regular exercise. This is one of the most important things you can do for your health. Most adults should: Exercise for at least 150 minutes each week. The exercise should increase your heart rate and make you sweat (moderate-intensity exercise). Do strengthening exercises at least twice a week. This is in addition to the moderate-intensity exercise. Spend less time sitting. Even light physical activity can be beneficial. Watch cholesterol and blood lipids Have your blood tested for lipids and cholesterol at 20 years of age, then have this test every 5 years. You may need to have your cholesterol levels checked more often if: Your lipid or cholesterol levels are high. You are older than 21 years of age. You are at high risk for heart disease. What should I know about cancer screening? Many types of cancers can be detected early and may often be prevented. Depending on your health history and family history, you may need to have cancer screening at various ages. This may include screening for: Colorectal cancer. Prostate cancer. Skin cancer. Lung  cancer. What should I know about heart disease, diabetes, and high blood pressure? Blood pressure and heart disease High blood pressure causes heart disease and increases the risk of stroke. This is more likely to develop in people who have high blood pressure readings or are overweight. Talk with your health care provider about your target blood pressure readings. Have your blood pressure checked: Every 3-5 years if you are 18-39 years of age. Every year if you are 40 years old or older. If you are between the ages of 65 and 75 and are a current or former smoker, ask your health care provider if you should have a one-time screening for abdominal aortic aneurysm (AAA). Diabetes Have regular diabetes screenings. This checks your fasting blood sugar level. Have the screening done: Once every three years after age 45 if you are at a normal weight and have a low risk for diabetes. More often and at a younger age if you are overweight or have a high risk for diabetes. What should I know about preventing infection? Hepatitis B If you have a higher risk for hepatitis B, you should be screened for this virus. Talk with your health care provider to find out if you are at risk for hepatitis B infection. Hepatitis C Blood testing is recommended for: Everyone born from 1945 through 1965. Anyone with known risk factors for hepatitis C. Sexually transmitted infections (STIs) You should be screened each year for STIs, including gonorrhea and chlamydia, if: You are sexually active and are younger than 21 years of age. You are older than 21 years of age and your   health care provider tells you that you are at risk for this type of infection. Your sexual activity has changed since you were last screened, and you are at increased risk for chlamydia or gonorrhea. Ask your health care provider if you are at risk. Ask your health care provider about whether you are at high risk for HIV. Your health care provider  may recommend a prescription medicine to help prevent HIV infection. If you choose to take medicine to prevent HIV, you should first get tested for HIV. You should then be tested every 3 months for as long as you are taking the medicine. Follow these instructions at home: Alcohol use Do not drink alcohol if your health care provider tells you not to drink. If you drink alcohol: Limit how much you have to 0-2 drinks a day. Know how much alcohol is in your drink. In the [place] one drink equals one 12 oz bottle of beer (355 mL), one 5 oz glass of wine (148 mL), or one 1 oz glass of hard liquor (44 mL). Lifestyle Do not use any products that contain nicotine or tobacco. These products include cigarettes, chewing tobacco, and vaping devices, such as e-cigarettes. If you need help quitting, ask your health care provider. Do not use street drugs. Do not share needles. Ask your health care provider for help if you need support or information about quitting drugs. General instructions Schedule regular health, dental, and eye exams. Stay current with your vaccines. Tell your health care provider if: You often feel depressed. You have ever been abused or do not feel safe at home. Summary Adopting a healthy lifestyle and getting preventive care are important in promoting health and wellness. Follow your health care provider's instructions about healthy diet, exercising, and getting tested or screened for diseases. Follow your health care provider's instructions on monitoring your cholesterol and blood pressure. This information is not intended to replace advice given to you by your health care provider. Make sure you discuss any questions you have with your health care provider. Document Revised: 05/24/2020 Document Reviewed: 05/24/2020 Elsevier Patient Education  2023 [employer]  

## 2021-06-29 LAB — HIV ANTIBODY (ROUTINE TESTING W REFLEX): HIV 1&2 Ab, 4th Generation: NONREACTIVE

## 2021-06-29 LAB — HEPATITIS C ANTIBODY
Hepatitis C Ab: NONREACTIVE
SIGNAL TO CUT-OFF: 0.09 (ref <1.00)

## 2021-07-27 ENCOUNTER — Ambulatory Visit: Payer: BC Managed Care – PPO | Admitting: Emergency Medicine

## 2021-07-27 ENCOUNTER — Encounter: Payer: Self-pay | Admitting: Emergency Medicine

## 2021-07-27 VITALS — BP 126/84 | HR 69 | Temp 98.5°F | Ht 68.0 in | Wt 207.2 lb

## 2021-07-27 DIAGNOSIS — Z7901 Long term (current) use of anticoagulants: Secondary | ICD-10-CM | POA: Diagnosis not present

## 2021-07-27 DIAGNOSIS — M7989 Other specified soft tissue disorders: Secondary | ICD-10-CM | POA: Diagnosis not present

## 2021-07-27 DIAGNOSIS — Z86718 Personal history of other venous thrombosis and embolism: Secondary | ICD-10-CM | POA: Diagnosis not present

## 2021-07-27 NOTE — Patient Instructions (Signed)
Deep Vein Thrombosis  Deep vein thrombosis (DVT) is a condition in which a blood clot forms in a vein of the deep venous system. This can occur in the lower leg, thigh, pelvis, arm, or neck. A clot is blood that has thickened into a gel or solid. This condition is serious and can be life-threatening if the clot travels to the arteries of the lungs and causes a blockage (pulmonary embolism). A DVT can also damage veins in the leg, which can lead to long-term venous disease, leg pain, swelling, discoloration, and ulcers or sores (post-thrombotic syndrome). What are the causes? This condition may be caused by: A slowdown of blood flow. Damage to a vein. A condition that causes blood to clot more easily, such as certain bleeding disorders. What increases the risk? The following factors may make you more likely to develop this condition: Obesity. Being older, especially older than age 60. Being inactive or not moving around (sedentary lifestyle). This may include: Sitting or lying down for longer than 4-6 hours other than to sleep at night. Being in the hospital, or having major or lengthy surgery. Having any recent bone injuries, such as breaks (fractures), that reduce movement, especially in the lower extremities. Having recent orthopedic surgery on the lower extremities. Being pregnant, giving birth, or having recently given birth. Taking medicines that contain estrogen, such as birth control or hormone replacement therapy. Using products that contain nicotine or tobacco, especially if you use hormonal birth control. Having a history of a blood vessel disease (peripheral vascular disease) or congestive heart disease. Having a history of cancer, especially if being treated with chemotherapy. What are the signs or symptoms? Symptoms of this condition include: Swelling, pain, pressure, or tenderness in an arm or a leg. An arm or a leg becoming warm, red, or discolored. A leg turning very pale or  blue. You may have a large DVT. This is rare. If the clot is in your leg, you may notice that symptoms get worse when you stand or walk. In some cases, there are no symptoms. How is this diagnosed? This condition is diagnosed with: Your medical history and a physical exam. Tests, such as: Blood tests to check how well your blood clots. Doppler ultrasound. This is the best way to find a DVT. CT venogram. Contrast dye is injected into a vein, and X-rays are taken to check for clots. This is helpful for veins in the chest or pelvis. How is this treated? Treatment for this condition depends on: The cause of your DVT. The size and location of your DVT, or having more than one DVT. Your risk for bleeding or developing more clots. Other medical conditions you may have. Treatment may include: Taking a blood thinner medicine (anticoagulant) to prevent more clots from forming or current clots from growing. Wearing compression stockings. Injecting medicines into the affected vein to break up the clot (catheter-directed thrombolysis). Surgical procedures, when DVT is severe or hard to treat. These may be done to: Isolate and remove your clot. Place an inferior vena cava (IVC) filter. This filter is placed into a large vein called the inferior vena cava to catch blood clots before they reach your lungs. You may get some medical treatments for 6 months or longer. Follow these instructions at home: If you are taking blood thinners: Talk with your health care provider before you take any medicines that contain aspirin or NSAIDs, such as ibuprofen. These medicines increase your risk for dangerous bleeding. Take your medicine exactly   as told, at the same time every day. Do not skip a dose. Do not take more than the prescribed dose. This is important. Ask your health care provider about foods and medicines that could change or interact with the way your blood thinner works. Avoid these foods and medicines  if you are told to do so. Avoid anything that may cause bleeding or bruising. You may bleed more easily while taking blood thinners. Be very careful when using knives, scissors, or other sharp objects. Use an electric razor instead of a blade. Avoid activities that could cause injury or bruising, and follow instructions for preventing falls. Tell your health care provider if you have had any internal bleeding, bleeding ulcers, or neurologic diseases, such as strokes or cerebral aneurysms. Wear a medical alert bracelet or carry a card that lists what medicines you take. General instructions Take over-the-counter and prescription medicines only as told by your health care provider. Return to your normal activities as told by your health care provider. Ask your health care provider what activities are safe for you. If recommended, wear compression stockings as told by your health care provider. These stockings help to prevent blood clots and reduce swelling in your legs. Never wear your compression stockings while sleeping at night. Keep all follow-up visits. This is important. Where to find more information American Heart Association: www.heart.org Centers for Disease Control and Prevention: www.cdc.gov National Heart, Lung, and Blood Institute: www.nhlbi.nih.gov Contact a health care provider if: You miss a dose of your blood thinner. You have unusual bruising or other color changes. You have new or worse pain, swelling, or redness in an arm or a leg. You have worsening numbness or tingling in an arm or a leg. You have a significant color change (pale or blue) in the extremity that has the DVT. Get help right away if: You have signs or symptoms that a blood clot has moved to the lungs. These may include: Shortness of breath. Chest pain. Fast or irregular heartbeats (palpitations). Light-headedness, dizziness, or fainting. Coughing up blood. You have signs or symptoms that your blood is  too thin. These may include: Blood in your vomit, stool, or urine. A cut that will not stop bleeding. A menstrual period that is heavier than usual. A severe headache or confusion. These symptoms may be an emergency. Get help right away. Call 911. Do not wait to see if the symptoms will go away. Do not drive yourself to the hospital. Summary Deep vein thrombosis (DVT) happens when a blood clot forms in a deep vein. This may occur in the lower leg, thigh, pelvis, arm, or neck. Symptoms affect the arm or leg and can include swelling, pain, tenderness, warmth, redness, or discoloration. This condition may be treated with medicines. In severe cases, a procedure or surgery may be done to remove or dissolve the clots. If you are taking blood thinners, take them exactly as told. Do not skip a dose. Do not take more than is prescribed. Get help right away if you have a severe headache, shortness of breath, chest pain, fast or irregular heartbeats, or blood in your vomit, urine, or stool. This information is not intended to replace advice given to you by your health care provider. Make sure you discuss any questions you have with your health care provider. Document Revised: 07/26/2020 Document Reviewed: 07/26/2020 Else[employer] Patient Education  2023 [employer]  

## 2021-07-27 NOTE — Assessment & Plan Note (Signed)
Recommend repeat lower extremity ultrasound. Leg elevation.  Continue Eliquis 5 mg twice a day.

## 2021-07-27 NOTE — Progress Notes (Signed)
Norman Nelson 21 y.o.   Chief Complaint  Patient presents with   Edema    Left leg swollen x 3 days , Pt has Hx DVT    HISTORY OF PRESENT ILLNESS: This is a 21 y.o. 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 No other complaints or medical concerns today.  HPI   Prior to Admission medications   Medication Sig Start Date End Date Taking? Authorizing Provider  apixaban (ELIQUIS) 5 MG TABS tablet Take 1 tablet (5 mg total) by mouth 2 (two) times daily. 06/03/21  Yes Erenest Blank, NP    No Known Allergies  There are no problems to display for this patient.   No past medical history on file.  No past surgical history on file.  Social History   Socioeconomic History   Marital status: Single    Spouse name: Not on file   Number of children: Not on file   Years of education: Not on file   Highest education level: Not on file  Occupational History   Not on file  Tobacco Use   Smoking status: Never   Smokeless tobacco: Never  Substance and Sexual Activity   Alcohol use: Not Currently   Drug use: Not Currently   Sexual activity: Not on file  Other Topics Concern   Not on file  Social History Narrative   Not on file   Social Determinants of Health   Financial Resource Strain: Not on file  Food Insecurity: Not on file  Transportation Needs: Not on file  Physical Activity: Not on file  Stress: Not on file  Social Connections: Not on file  Intimate Partner Violence: Not on file    No family history on file.   Review of Systems  Constitutional: Negative.  Negative for chills and fever.  HENT: Negative.  Negative for congestion and sore throat.   Respiratory: Negative.  Negative for cough and shortness of breath.   Cardiovascular: Negative.  Negative for chest pain and palpitations.  Gastrointestinal:  Negative for abdominal pain,  diarrhea, nausea and vomiting.  Genitourinary: Negative.   Skin: Negative.  Negative for rash.  Neurological: Negative.  Negative for dizziness and headaches.  All other systems reviewed and are negative.  Today's Vitals   07/27/21 1022  BP: 126/84  Pulse: 69  Temp: 98.5 F (36.9 C)  TempSrc: Oral  SpO2: 97%  Weight: 207 lb 4 oz (94 kg)  Height: 5\' 8"  (1.727 m)   Body mass index is 31.51 kg/m.   Physical Exam Vitals reviewed.  Constitutional:      Appearance: Normal appearance.  HENT:     Head: Normocephalic.  Eyes:     Extraocular Movements: Extraocular movements intact.     Pupils: Pupils are equal, round, and reactive to light.  Cardiovascular:     Rate and Rhythm: Normal rate and regular rhythm.     Pulses: Normal pulses.     Heart sounds: Normal heart sounds.  Pulmonary:     Effort: Pulmonary effort is normal.     Breath sounds: Normal breath sounds.  Musculoskeletal:     Cervical back: No tenderness.     Comments: Left lower extremity: Mild swelling of foot and ankle area.  Neurovascularly intact.  Excellent peripheral pulses and capillary refill.  No tenderness.  Full range of motion.  Lymphadenopathy:  Cervical: No cervical adenopathy.  Skin:    General: Skin is warm and dry.     Capillary Refill: Capillary refill takes less than 2 seconds.  Neurological:     General: No focal deficit present.     Mental Status: He is alert and oriented to person, place, and time.  Psychiatric:        Mood and Affect: Mood normal.        Behavior: Behavior normal.      ASSESSMENT & PLAN: Problem List Items Addressed This Visit       Other   Leg swelling - Primary    Recommend repeat lower extremity ultrasound. Leg elevation.  Continue Eliquis 5 mg twice a day.      Relevant Orders   VAS [place] LOWER EXTREMITY VENOUS (DVT)   History of DVT (deep vein thrombosis)    Stable.  Continue Eliquis 5 mg twice a day.      Relevant Orders   VAS [place] LOWER EXTREMITY  VENOUS (DVT)   Current use of long term anticoagulation    No bleeding problems.  Fall precautions given.      Patient Instructions  Deep Vein Thrombosis  Deep vein thrombosis (DVT) is a condition in which a blood clot forms in a vein of the deep venous system. This can occur in the lower leg, thigh, pelvis, arm, or neck. A clot is blood that has thickened into a gel or solid. This condition is serious and can be life-threatening if the clot travels to the arteries of the lungs and causes a blockage (pulmonary embolism). A DVT can also damage veins in the leg, which can lead to long-term venous disease, leg pain, swelling, discoloration, and ulcers or sores (post-thrombotic syndrome). What are the causes? This condition may be caused by: A slowdown of blood flow. Damage to a vein. A condition that causes blood to clot more easily, such as certain bleeding disorders. What increases the risk? The following factors may make you more likely to develop this condition: Obesity. Being older, especially older than age 27. Being inactive or not moving around (sedentary lifestyle). This may include: Sitting or lying down for longer than 4-6 hours other than to sleep at night. Being in the hospital, or having major or lengthy surgery. Having any recent bone injuries, such as breaks (fractures), that reduce movement, especially in the lower extremities. Having recent orthopedic surgery on the lower extremities. Being pregnant, giving birth, or having recently given birth. Taking medicines that contain estrogen, such as birth control or hormone replacement therapy. Using products that contain nicotine or tobacco, especially if you use hormonal birth control. Having a history of a blood vessel disease (peripheral vascular disease) or congestive heart disease. Having a history of cancer, especially if being treated with chemotherapy. What are the signs or symptoms? Symptoms of this condition  include: Swelling, pain, pressure, or tenderness in an arm or a leg. An arm or a leg becoming warm, red, or discolored. A leg turning very pale or blue. You may have a large DVT. This is rare. If the clot is in your leg, you may notice that symptoms get worse when you stand or walk. In some cases, there are no symptoms. How is this diagnosed? This condition is diagnosed with: Your medical history and a physical exam. Tests, such as: Blood tests to check how well your blood clots. Doppler ultrasound. This is the best way to find a DVT. CT venogram. Contrast dye is injected  into a vein, and X-rays are taken to check for clots. This is helpful for veins in the chest or pelvis. How is this treated? Treatment for this condition depends on: The cause of your DVT. The size and location of your DVT, or having more than one DVT. Your risk for bleeding or developing more clots. Other medical conditions you may have. Treatment may include: Taking a blood thinner medicine (anticoagulant) to prevent more clots from forming or current clots from growing. Wearing compression stockings. Injecting medicines into the affected vein to break up the clot (catheter-directed thrombolysis). Surgical procedures, when DVT is severe or hard to treat. These may be done to: Isolate and remove your clot. Place an inferior vena cava (IVC) filter. This filter is placed into a large vein called the inferior vena cava to catch blood clots before they reach your lungs. You may get some medical treatments for 6 months or longer. Follow these instructions at home: If you are taking blood thinners: Talk with your health care provider before you take any medicines that contain aspirin or NSAIDs, such as ibuprofen. These medicines increase your risk for dangerous bleeding. Take your medicine exactly as told, at the same time every day. Do not skip a dose. Do not take more than the prescribed dose. This is important. Ask your  health care provider about foods and medicines that could change or interact with the way your blood thinner works. Avoid these foods and medicines if you are told to do so. Avoid anything that may cause bleeding or bruising. You may bleed more easily while taking blood thinners. Be very careful when using knives, scissors, or other sharp objects. Use an electric razor instead of a blade. Avoid activities that could cause injury or bruising, and follow instructions for preventing falls. Tell your health care provider if you have had any internal bleeding, bleeding ulcers, or neurologic diseases, such as strokes or cerebral aneurysms. Wear a medical alert bracelet or carry a card that lists what medicines you take. General instructions Take over-the-counter and prescription medicines only as told by your health care provider. Return to your normal activities as told by your health care provider. Ask your health care provider what activities are safe for you. If recommended, wear compression stockings as told by your health care provider. These stockings help to prevent blood clots and reduce swelling in your legs. Never wear your compression stockings while sleeping at night. Keep all follow-up visits. This is important. Where to find more information American Heart Association: www.heart.org Centers for Disease Control and Prevention: FootballExhibition.com.br National Heart, Lung, and Blood Institute: PopSteam.is Contact a health care provider if: You miss a dose of your blood thinner. You have unusual bruising or other color changes. You have new or worse pain, swelling, or redness in an arm or a leg. You have worsening numbness or tingling in an arm or a leg. You have a significant color change (pale or blue) in the extremity that has the DVT. Get help right away if: You have signs or symptoms that a blood clot has moved to the lungs. These may include: Shortness of breath. Chest pain. Fast or  irregular heartbeats (palpitations). Light-headedness, dizziness, or fainting. Coughing up blood. You have signs or symptoms that your blood is too thin. These may include: Blood in your vomit, stool, or urine. A cut that will not stop bleeding. A menstrual period that is heavier than usual. A severe headache or confusion. These symptoms may be an  emergency. Get help right away. Call 911. Do not wait to see if the symptoms will go away. Do not drive yourself to the hospital. Summary Deep vein thrombosis (DVT) happens when a blood clot forms in a deep vein. This may occur in the lower leg, thigh, pelvis, arm, or neck. Symptoms affect the arm or leg and can include swelling, pain, tenderness, warmth, redness, or discoloration. This condition may be treated with medicines. In severe cases, a procedure or surgery may be done to remove or dissolve the clots. If you are taking blood thinners, take them exactly as told. Do not skip a dose. Do not take more than is prescribed. Get help right away if you have a severe headache, shortness of breath, chest pain, fast or irregular heartbeats, or blood in your vomit, urine, or stool. This information is not intended to replace advice given to you by your health care provider. Make sure you discuss any questions you have with your health care provider. Document Revised: 07/26/2020 Document Reviewed: 07/26/2020 Elsevier Patient Education  2023 Elsevier Inc.     Edwina Barth, MD [facility]

## 2021-07-27 NOTE — Assessment & Plan Note (Signed)
Stable. Continue Eliquis 5mg twice a day

## 2021-07-27 NOTE — Assessment & Plan Note (Signed)
No bleeding problems.  Fall precautions given.

## 2021-08-22 ENCOUNTER — Inpatient Hospital Stay: Payer: BC Managed Care – PPO | Attending: Family

## 2021-08-22 ENCOUNTER — Ambulatory Visit (HOSPITAL_BASED_OUTPATIENT_CLINIC_OR_DEPARTMENT_OTHER): Payer: BC Managed Care – PPO

## 2021-08-22 ENCOUNTER — Inpatient Hospital Stay: Payer: BC Managed Care – PPO | Admitting: Family

## 2021-08-27 ENCOUNTER — Ambulatory Visit (HOSPITAL_BASED_OUTPATIENT_CLINIC_OR_DEPARTMENT_OTHER)
Admission: RE | Admit: 2021-08-27 | Discharge: 2021-08-27 | Disposition: A | Discharge status: Home / Self Care | Payer: BC Managed Care – PPO | Source: Ambulatory Visit | Attending: Family | Admitting: Family

## 2021-08-27 DIAGNOSIS — I824Y9 Acute embolism and thrombosis of unspecified deep veins of unspecified proximal lower extremity: Secondary | ICD-10-CM | POA: Diagnosis not present

## 2021-08-27 DIAGNOSIS — I82442 Acute embolism and thrombosis of left tibial vein: Secondary | ICD-10-CM | POA: Diagnosis not present

## 2021-08-31 ENCOUNTER — Telehealth: Payer: Self-pay | Admitting: *Deleted

## 2021-08-31 NOTE — Telephone Encounter (Signed)
Per scheduling message Maralyn Sago - Called and lvm to reschedule appointment.

## 2021-09-20 ENCOUNTER — Encounter: Payer: Self-pay | Admitting: Family

## 2021-09-20 ENCOUNTER — Inpatient Hospital Stay: Payer: BC Managed Care – PPO

## 2021-09-20 ENCOUNTER — Inpatient Hospital Stay: Payer: BC Managed Care – PPO | Attending: Family | Admitting: Family

## 2021-09-20 VITALS — BP 124/57 | HR 62 | Temp 98.4°F | Resp 17 | Wt 208.0 lb

## 2021-09-20 DIAGNOSIS — D6862 Lupus anticoagulant syndrome: Secondary | ICD-10-CM | POA: Insufficient documentation

## 2021-09-20 DIAGNOSIS — Z7901 Long term (current) use of anticoagulants: Secondary | ICD-10-CM | POA: Diagnosis not present

## 2021-09-20 DIAGNOSIS — D6859 Other primary thrombophilia: Secondary | ICD-10-CM

## 2021-09-20 DIAGNOSIS — R760 Raised antibody titer: Secondary | ICD-10-CM | POA: Diagnosis not present

## 2021-09-20 DIAGNOSIS — Z86718 Personal history of other venous thrombosis and embolism: Secondary | ICD-10-CM | POA: Diagnosis not present

## 2021-09-20 DIAGNOSIS — I824Y9 Acute embolism and thrombosis of unspecified deep veins of unspecified proximal lower extremity: Secondary | ICD-10-CM | POA: Diagnosis not present

## 2021-09-20 LAB — CMP (CANCER CENTER ONLY)
ALT: 9 U/L (ref 0–44)
AST: 13 U/L — ABNORMAL LOW (ref 15–41)
Albumin: 4.4 g/dL (ref 3.5–5.0)
Alkaline Phosphatase: 57 U/L (ref 38–126)
Anion gap: 6 (ref 5–15)
BUN: 12 mg/dL (ref 6–20)
CO2: 28 mmol/L (ref 22–32)
Calcium: 9.9 mg/dL (ref 8.9–10.3)
Chloride: 104 mmol/L (ref 98–111)
Creatinine: 1.19 mg/dL (ref 0.61–1.24)
GFR, Estimated: >60 mL/min (ref >60)
Glucose, Bld: 98 mg/dL (ref 70–99)
Potassium: 4.5 mmol/L (ref 3.5–5.1)
Sodium: 138 mmol/L (ref 135–145)
Total Bilirubin: 0.7 mg/dL (ref 0.3–1.2)
Total Protein: 7.5 g/dL (ref 6.5–8.1)

## 2021-09-20 LAB — CBC WITH DIFFERENTIAL (CANCER CENTER ONLY)
Abs Immature Granulocytes: 0.02 10*3/uL (ref 0.00–0.07)
Basophils Absolute: 0.1 10*3/uL (ref 0.0–0.1)
Basophils Relative: 1 %
Eosinophils Absolute: 0.1 10*3/uL (ref 0.0–0.5)
Eosinophils Relative: 1 %
HCT: 42.5 % (ref 39.0–52.0)
Hemoglobin: 14.2 g/dL (ref 13.0–17.0)
Immature Granulocytes: 0 %
Lymphocytes Relative: 33 %
Lymphs Abs: 2.1 10*3/uL (ref 0.7–4.0)
MCH: 31.1 pg (ref 26.0–34.0)
MCHC: 33.4 g/dL (ref 30.0–36.0)
MCV: 93.2 fL (ref 80.0–100.0)
Monocytes Absolute: 0.5 10*3/uL (ref 0.1–1.0)
Monocytes Relative: 7 %
Neutro Abs: 3.7 10*3/uL (ref 1.7–7.7)
Neutrophils Relative %: 58 %
Platelet Count: 275 10*3/uL (ref 150–400)
RBC: 4.56 MIL/uL (ref 4.22–5.81)
RDW: 11.5 % (ref 11.5–15.5)
WBC Count: 6.5 10*3/uL (ref 4.0–10.5)
nRBC: 0 % (ref 0.0–0.2)

## 2021-09-20 LAB — D-DIMER, QUANTITATIVE: D-Dimer, Quant: <0.27 ug/mL-FEU (ref 0.00–0.50)

## 2021-09-20 NOTE — Progress Notes (Signed)
Hematology and Oncology Follow Up Visit  Norman Nelson [id number] [birth year] 21 y.o. 09/20/2021   Principle Diagnosis:  Left lower extremity DVT - ? Provoked by travel/dehydration Positive lupus anticoagulant  Current Therapy:   Eliquis 5 mg PO BID   Interim History:  Norman Nelson is here today with his dad for follow-up. He is doing well but does note occasional mild swelling in the left ankle. This comes and goes. Pedal pulses are +2 bilaterally.  He is doing well on Eliquis and taking BID as prescribed.  No blood loss, bruising or petechiae reported.  [place] 2 weeks ago showed complete resolution of DVT.  No fever, chills, n/v, cough, rash, dizziness, SOB, chest pain, palpitations, abdominal pain or changes in bowel or bladder habits.  No swelling, tenderness, numbness or tingling in his extremities at this time.  No falls or syncope.  Appetite and hydration are good. His weight is stable at 208 lbs.   ECOG Performance Status: 1 - Symptomatic but completely ambulatory  Medications:  Allergies as of 09/20/2021   No Known Allergies      Medication List        Accurate as of September 20, 2021 11:33 AM. If you have any questions, ask your nurse or doctor.          apixaban 5 MG Tabs tablet Commonly known as: ELIQUIS Take 1 tablet (5 mg total) by mouth 2 (two) times daily.        Allergies: No Known Allergies  Past Medical History, Surgical history, Social history, and Family History were reviewed and updated.  Review of Systems: All other 10 point review of systems is negative.   Physical Exam:  weight is 208 lb (94.3 kg). His oral temperature is 98.4 F (36.9 C). His blood pressure is 124/57 (abnormal) and his pulse is 62. His respiration is 17 and oxygen saturation is 100%.   Wt Readings from Last 3 Encounters:  09/20/21 208 lb (94.3 kg)  07/27/21 207 lb 4 oz (94 kg)  06/28/21 210 lb 2 oz (95.3 kg)    Ocular: Sclerae unicteric, pupils equal,  round and reactive to light Ear-nose-throat: Oropharynx clear, dentition fair Lymphatic: No cervical or supraclavicular adenopathy Lungs no rales or rhonchi, good excursion bilaterally Heart regular rate and rhythm, no murmur appreciated Abd soft, nontender, positive bowel sounds MSK no focal spinal tenderness, no joint edema Neuro: non-focal, well-oriented, appropriate affect Breasts: Deferred   Lab Results  Component Value Date   WBC 6.5 09/20/2021   HGB 14.2 09/20/2021   HCT 42.5 09/20/2021   MCV 93.2 09/20/2021   PLT 275 09/20/2021   No results found for: "FERRITIN", "IRON", "TIBC", "UIBC", "IRONPCTSAT" Lab Results  Component Value Date   RBC 4.56 09/20/2021   No results found for: "KPAFRELGTCHN", "LAMBDASER", "KAPLAMBRATIO" No results found for: "IGGSERUM", "IGA", "IGMSERUM" No results found for: "TOTALPROTELP", "ALBUMINELP", "A1GS", "A2GS", "BETS", "BETA2SER", "GAMS", "MSPIKE", "SPEI"   Chemistry      Component Value Date/Time   NA 138 09/20/2021 1032   K 4.5 09/20/2021 1032   CL 104 09/20/2021 1032   CO2 28 09/20/2021 1032   BUN 12 09/20/2021 1032   CREATININE 1.19 09/20/2021 1032      Component Value Date/Time   CALCIUM 9.9 09/20/2021 1032   ALKPHOS 57 09/20/2021 1032   AST 13 (L) 09/20/2021 1032   ALT 9 09/20/2021 1032   BILITOT 0.7 09/20/2021 1032       Impression and Plan: Norman Nelson is a very  pleasant 21 yo gentleman with recent diagnosis diagnosis or left lower extremity DVT involving the posterior tibial vein and peroneal vein. Lupus anticoagulant positive.  US showed resolution of clot in August.  He continues to tolerate Eliquis nicely. Will complete one year of full dose and transition to maintenance therapy in May 2024.  Follow-up in 6 months.   Eileen Stanford, NP 9/5/202311:33 AM

## 2021-09-21 LAB — LUPUS ANTICOAGULANT PANEL
DRVVT: 40.2 s (ref 0.0–47.0)
PTT Lupus Anticoagulant: 36.3 s (ref 0.0–43.5)

## 2021-10-10 ENCOUNTER — Other Ambulatory Visit: Payer: Self-pay | Admitting: Family

## 2021-10-10 DIAGNOSIS — I824Y9 Acute embolism and thrombosis of unspecified deep veins of unspecified proximal lower extremity: Secondary | ICD-10-CM

## 2021-10-10 DIAGNOSIS — D6859 Other primary thrombophilia: Secondary | ICD-10-CM

## 2021-11-03 ENCOUNTER — Other Ambulatory Visit: Payer: Self-pay | Admitting: Family

## 2021-11-03 DIAGNOSIS — D6859 Other primary thrombophilia: Secondary | ICD-10-CM

## 2021-11-03 DIAGNOSIS — I824Y9 Acute embolism and thrombosis of unspecified deep veins of unspecified proximal lower extremity: Secondary | ICD-10-CM

## 2021-12-03 ENCOUNTER — Other Ambulatory Visit: Payer: Self-pay | Admitting: Hematology & Oncology

## 2021-12-03 DIAGNOSIS — I824Y9 Acute embolism and thrombosis of unspecified deep veins of unspecified proximal lower extremity: Secondary | ICD-10-CM

## 2021-12-03 DIAGNOSIS — D6859 Other primary thrombophilia: Secondary | ICD-10-CM

## 2021-12-28 ENCOUNTER — Ambulatory Visit: Payer: BC Managed Care – PPO | Admitting: Emergency Medicine

## 2021-12-30 ENCOUNTER — Other Ambulatory Visit: Payer: Self-pay | Admitting: Hematology & Oncology

## 2021-12-30 DIAGNOSIS — I824Y9 Acute embolism and thrombosis of unspecified deep veins of unspecified proximal lower extremity: Secondary | ICD-10-CM

## 2021-12-30 DIAGNOSIS — D6859 Other primary thrombophilia: Secondary | ICD-10-CM

## 2022-02-09 ENCOUNTER — Other Ambulatory Visit: Payer: Self-pay | Admitting: Hematology & Oncology

## 2022-02-09 DIAGNOSIS — D6859 Other primary thrombophilia: Secondary | ICD-10-CM

## 2022-02-09 DIAGNOSIS — I824Y9 Acute embolism and thrombosis of unspecified deep veins of unspecified proximal lower extremity: Secondary | ICD-10-CM

## 2022-03-15 ENCOUNTER — Other Ambulatory Visit: Payer: Self-pay | Admitting: Hematology & Oncology

## 2022-03-15 DIAGNOSIS — D6859 Other primary thrombophilia: Secondary | ICD-10-CM

## 2022-03-15 DIAGNOSIS — I824Y9 Acute embolism and thrombosis of unspecified deep veins of unspecified proximal lower extremity: Secondary | ICD-10-CM

## 2022-03-21 ENCOUNTER — Inpatient Hospital Stay: Payer: BC Managed Care – PPO | Admitting: Family

## 2022-03-21 ENCOUNTER — Inpatient Hospital Stay: Payer: BC Managed Care – PPO | Attending: Hematology & Oncology

## 2022-04-06 ENCOUNTER — Encounter: Payer: Self-pay | Admitting: Family

## 2022-05-07 ENCOUNTER — Other Ambulatory Visit: Payer: Self-pay | Admitting: Hematology & Oncology

## 2022-05-07 DIAGNOSIS — I824Y9 Acute embolism and thrombosis of unspecified deep veins of unspecified proximal lower extremity: Secondary | ICD-10-CM

## 2022-05-07 DIAGNOSIS — D6859 Other primary thrombophilia: Secondary | ICD-10-CM

## 2022-06-05 ENCOUNTER — Other Ambulatory Visit: Payer: Self-pay | Admitting: Hematology & Oncology

## 2022-06-05 DIAGNOSIS — D6859 Other primary thrombophilia: Secondary | ICD-10-CM

## 2022-06-05 DIAGNOSIS — I824Y9 Acute embolism and thrombosis of unspecified deep veins of unspecified proximal lower extremity: Secondary | ICD-10-CM

## 2022-06-13 ENCOUNTER — Encounter: Payer: Self-pay | Admitting: *Deleted

## 2022-06-13 NOTE — Progress Notes (Signed)
Eliquis approved by [facility] from 06/13/22-06/13/23.

## 2022-07-01 ENCOUNTER — Other Ambulatory Visit: Payer: Self-pay | Admitting: Hematology & Oncology

## 2022-07-01 DIAGNOSIS — D6859 Other primary thrombophilia: Secondary | ICD-10-CM

## 2022-07-01 DIAGNOSIS — I824Y9 Acute embolism and thrombosis of unspecified deep veins of unspecified proximal lower extremity: Secondary | ICD-10-CM

## 2022-08-04 ENCOUNTER — Other Ambulatory Visit: Payer: Self-pay | Admitting: Hematology & Oncology

## 2022-08-04 DIAGNOSIS — I824Y9 Acute embolism and thrombosis of unspecified deep veins of unspecified proximal lower extremity: Secondary | ICD-10-CM

## 2022-08-04 DIAGNOSIS — D6859 Other primary thrombophilia: Secondary | ICD-10-CM

## 2022-08-29 ENCOUNTER — Other Ambulatory Visit: Payer: Self-pay | Admitting: Hematology & Oncology

## 2022-08-29 DIAGNOSIS — I824Y9 Acute embolism and thrombosis of unspecified deep veins of unspecified proximal lower extremity: Secondary | ICD-10-CM

## 2022-08-29 DIAGNOSIS — D6859 Other primary thrombophilia: Secondary | ICD-10-CM

## 2022-09-25 DIAGNOSIS — F331 Major depressive disorder, recurrent, moderate: Secondary | ICD-10-CM | POA: Diagnosis not present

## 2022-09-25 DIAGNOSIS — F411 Generalized anxiety disorder: Secondary | ICD-10-CM | POA: Diagnosis not present

## 2022-09-25 DIAGNOSIS — Z79899 Other long term (current) drug therapy: Secondary | ICD-10-CM | POA: Diagnosis not present

## 2022-09-25 DIAGNOSIS — F902 Attention-deficit hyperactivity disorder, combined type: Secondary | ICD-10-CM | POA: Diagnosis not present

## 2022-09-25 DIAGNOSIS — F909 Attention-deficit hyperactivity disorder, unspecified type: Secondary | ICD-10-CM | POA: Diagnosis not present

## 2022-10-05 ENCOUNTER — Other Ambulatory Visit: Payer: Self-pay | Admitting: Hematology & Oncology

## 2022-10-05 DIAGNOSIS — D6859 Other primary thrombophilia: Secondary | ICD-10-CM

## 2022-10-05 DIAGNOSIS — I824Y9 Acute embolism and thrombosis of unspecified deep veins of unspecified proximal lower extremity: Secondary | ICD-10-CM

## 2022-10-17 DIAGNOSIS — F909 Attention-deficit hyperactivity disorder, unspecified type: Secondary | ICD-10-CM | POA: Diagnosis not present

## 2022-10-17 DIAGNOSIS — F411 Generalized anxiety disorder: Secondary | ICD-10-CM | POA: Diagnosis not present

## 2022-11-14 DIAGNOSIS — F909 Attention-deficit hyperactivity disorder, unspecified type: Secondary | ICD-10-CM | POA: Diagnosis not present

## 2022-11-14 DIAGNOSIS — Z79899 Other long term (current) drug therapy: Secondary | ICD-10-CM | POA: Diagnosis not present

## 2022-11-14 DIAGNOSIS — F411 Generalized anxiety disorder: Secondary | ICD-10-CM | POA: Diagnosis not present

## 2022-11-22 ENCOUNTER — Other Ambulatory Visit: Payer: Self-pay | Admitting: Hematology & Oncology

## 2022-11-22 DIAGNOSIS — I824Y9 Acute embolism and thrombosis of unspecified deep veins of unspecified proximal lower extremity: Secondary | ICD-10-CM

## 2022-11-22 DIAGNOSIS — D6859 Other primary thrombophilia: Secondary | ICD-10-CM

## 2022-12-12 DIAGNOSIS — Z79899 Other long term (current) drug therapy: Secondary | ICD-10-CM | POA: Diagnosis not present

## 2022-12-12 DIAGNOSIS — F909 Attention-deficit hyperactivity disorder, unspecified type: Secondary | ICD-10-CM | POA: Diagnosis not present

## 2022-12-12 DIAGNOSIS — F411 Generalized anxiety disorder: Secondary | ICD-10-CM | POA: Diagnosis not present

## 2023-01-02 ENCOUNTER — Other Ambulatory Visit: Payer: Self-pay | Admitting: Hematology & Oncology

## 2023-01-02 DIAGNOSIS — D6859 Other primary thrombophilia: Secondary | ICD-10-CM

## 2023-01-02 DIAGNOSIS — I824Y9 Acute embolism and thrombosis of unspecified deep veins of unspecified proximal lower extremity: Secondary | ICD-10-CM

## 2023-01-18 DIAGNOSIS — F909 Attention-deficit hyperactivity disorder, unspecified type: Secondary | ICD-10-CM | POA: Diagnosis not present

## 2023-01-18 DIAGNOSIS — F411 Generalized anxiety disorder: Secondary | ICD-10-CM | POA: Diagnosis not present

## 2023-01-18 DIAGNOSIS — Z79899 Other long term (current) drug therapy: Secondary | ICD-10-CM | POA: Diagnosis not present

## 2023-01-24 ENCOUNTER — Encounter: Payer: BC Managed Care – PPO | Admitting: Emergency Medicine

## 2023-01-31 ENCOUNTER — Other Ambulatory Visit: Payer: Self-pay | Admitting: Hematology & Oncology

## 2023-01-31 DIAGNOSIS — I824Y9 Acute embolism and thrombosis of unspecified deep veins of unspecified proximal lower extremity: Secondary | ICD-10-CM

## 2023-01-31 DIAGNOSIS — D6859 Other primary thrombophilia: Secondary | ICD-10-CM

## 2023-02-15 DIAGNOSIS — Z113 Encounter for screening for infections with a predominantly sexual mode of transmission: Secondary | ICD-10-CM | POA: Diagnosis not present

## 2023-02-15 DIAGNOSIS — B002 Herpesviral gingivostomatitis and pharyngotonsillitis: Secondary | ICD-10-CM | POA: Diagnosis not present

## 2023-02-15 DIAGNOSIS — Z114 Encounter for screening for human immunodeficiency virus [HIV]: Secondary | ICD-10-CM | POA: Diagnosis not present

## 2023-04-05 DIAGNOSIS — F909 Attention-deficit hyperactivity disorder, unspecified type: Secondary | ICD-10-CM | POA: Diagnosis not present

## 2023-04-05 DIAGNOSIS — F411 Generalized anxiety disorder: Secondary | ICD-10-CM | POA: Diagnosis not present

## 2023-04-05 DIAGNOSIS — Z79899 Other long term (current) drug therapy: Secondary | ICD-10-CM | POA: Diagnosis not present

## 2023-04-19 ENCOUNTER — Other Ambulatory Visit: Payer: Self-pay | Admitting: Hematology & Oncology

## 2023-04-19 DIAGNOSIS — D6859 Other primary thrombophilia: Secondary | ICD-10-CM

## 2023-04-19 DIAGNOSIS — I824Y9 Acute embolism and thrombosis of unspecified deep veins of unspecified proximal lower extremity: Secondary | ICD-10-CM

## 2023-04-20 MED ORDER — APIXABAN 5 MG PO TABS
5.0000 mg | ORAL_TABLET | Freq: Two times a day (BID) | ORAL | 0 refills | Status: DC
Start: 2023-04-20 — End: 2023-06-28

## 2023-05-03 DIAGNOSIS — Z79899 Other long term (current) drug therapy: Secondary | ICD-10-CM | POA: Diagnosis not present

## 2023-05-03 DIAGNOSIS — F411 Generalized anxiety disorder: Secondary | ICD-10-CM | POA: Diagnosis not present

## 2023-05-03 DIAGNOSIS — F909 Attention-deficit hyperactivity disorder, unspecified type: Secondary | ICD-10-CM | POA: Diagnosis not present

## 2023-05-29 DIAGNOSIS — Z79899 Other long term (current) drug therapy: Secondary | ICD-10-CM | POA: Diagnosis not present

## 2023-05-29 DIAGNOSIS — F909 Attention-deficit hyperactivity disorder, unspecified type: Secondary | ICD-10-CM | POA: Diagnosis not present

## 2023-05-29 DIAGNOSIS — F902 Attention-deficit hyperactivity disorder, combined type: Secondary | ICD-10-CM | POA: Diagnosis not present

## 2023-05-29 DIAGNOSIS — F411 Generalized anxiety disorder: Secondary | ICD-10-CM | POA: Diagnosis not present

## 2023-06-02 IMAGING — CT CT ANGIO CHEST
2 of 8 series · 19 of 36 positions shown · IV contrast (agent unspecified)
Comparison: None Available.

CLINICAL DATA: Pulmonary embolism (PE) suspected, unknown D-dimer
positive DVT

EXAM:
CT ANGIOGRAPHY CHEST WITH CONTRAST
TECHNIQUE: Multidetector CT imaging of the chest was performed using the
standard protocol during bolus administration of intravenous
contrast. Multiplanar CT image reconstructions and MIPs were
obtained to evaluate the vascular anatomy.

[Series 6: pe thins · axial · 0.66mm/px · z∈[-437,-135]mm · 18 of 338 slices shown]
[im 18/338  lung]
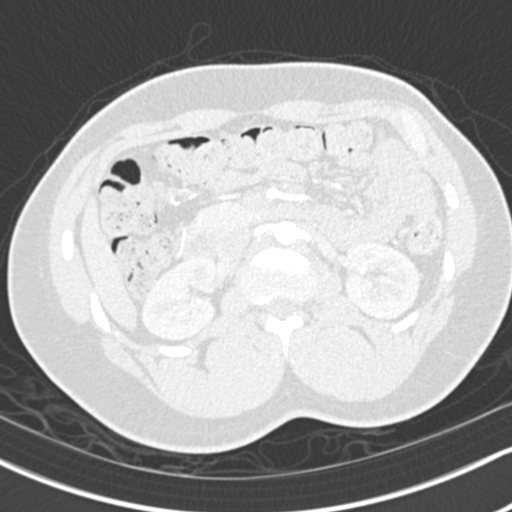
[im 36/338  mediastinal]
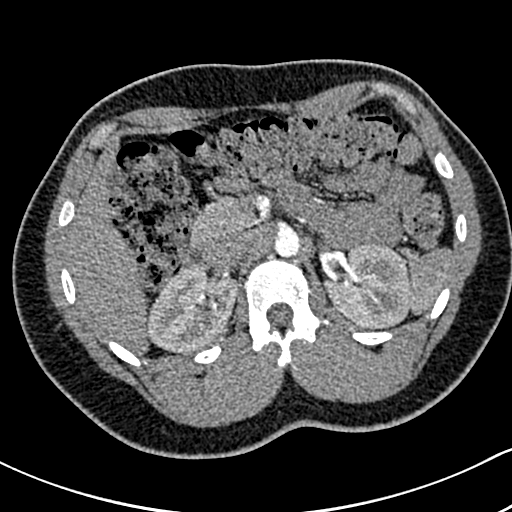
[im 54/338  lung]
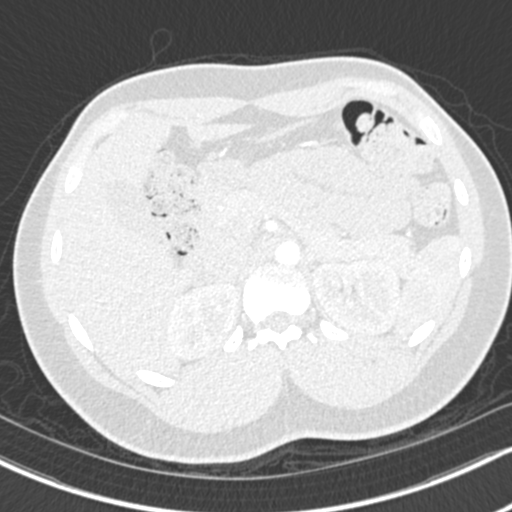
[im 71/338  mediastinal]
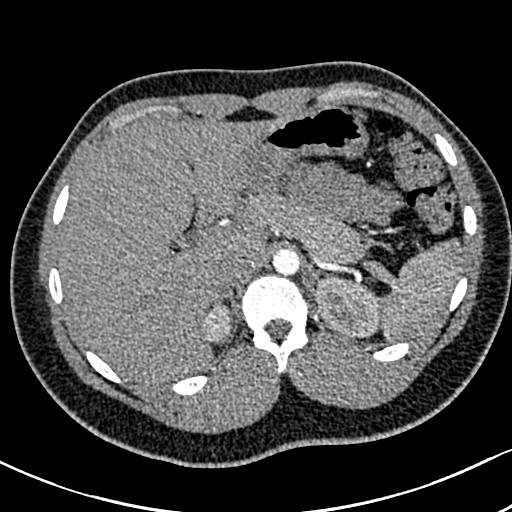
[im 89/338  lung]
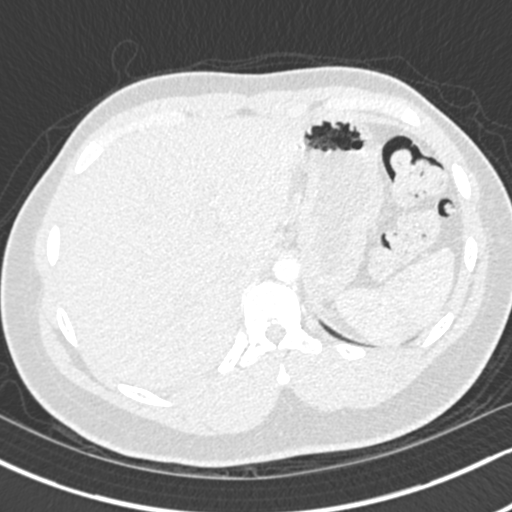
[im 107/338  mediastinal]
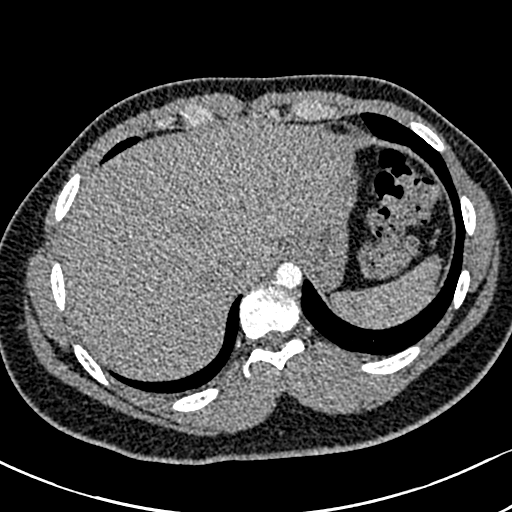
[im 125/338  lung]
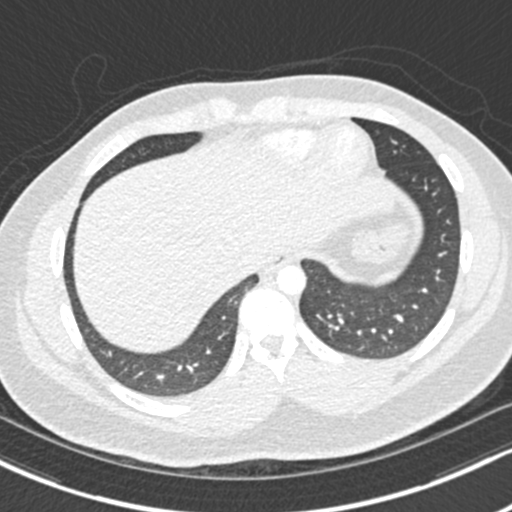
[im 142/338  mediastinal]
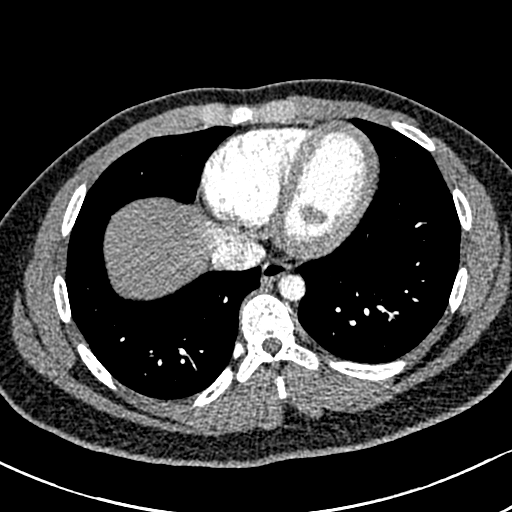
[im 160/338  lung]
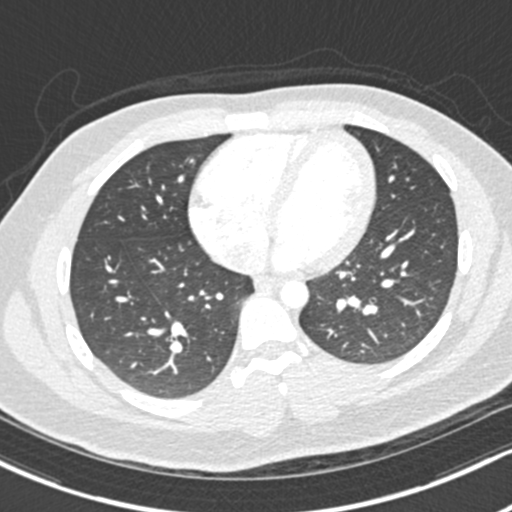
[im 178/338  mediastinal]
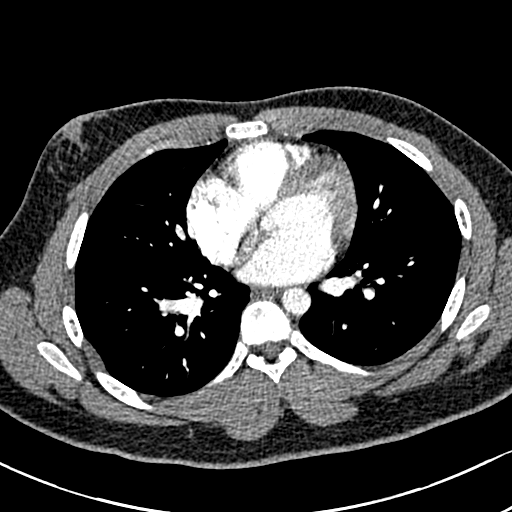
[im 196/338  lung]
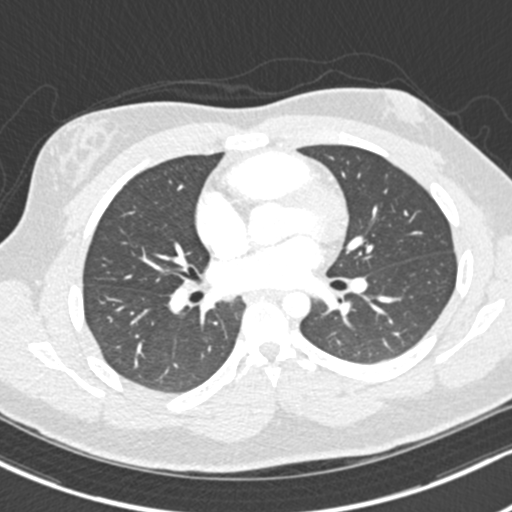
[im 213/338  mediastinal]
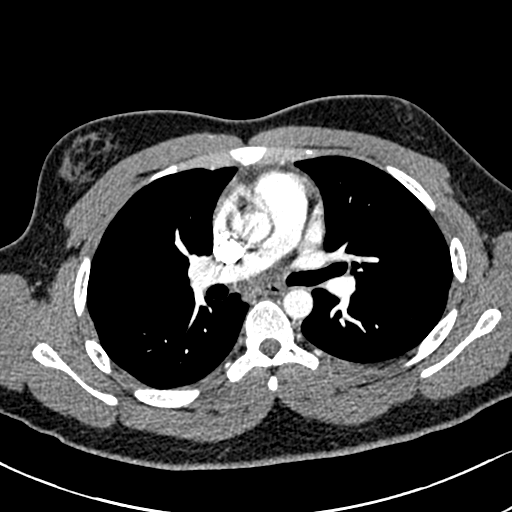
[im 231/338  lung]
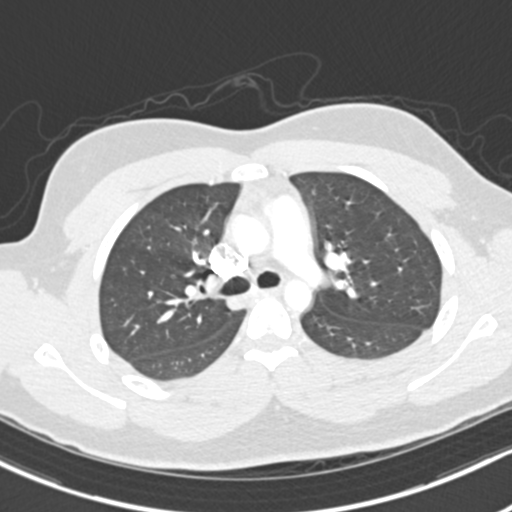
[im 249/338  mediastinal]
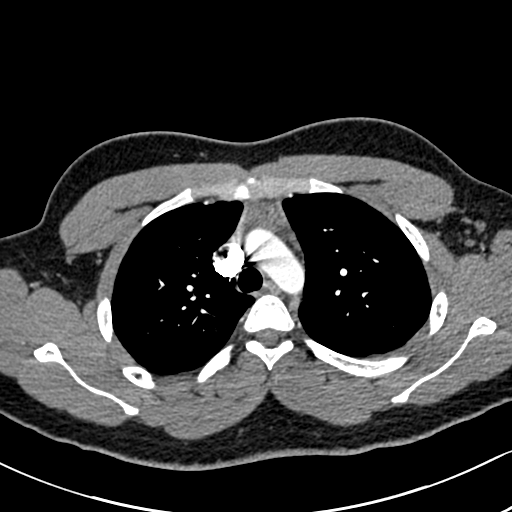
[im 267/338  lung]
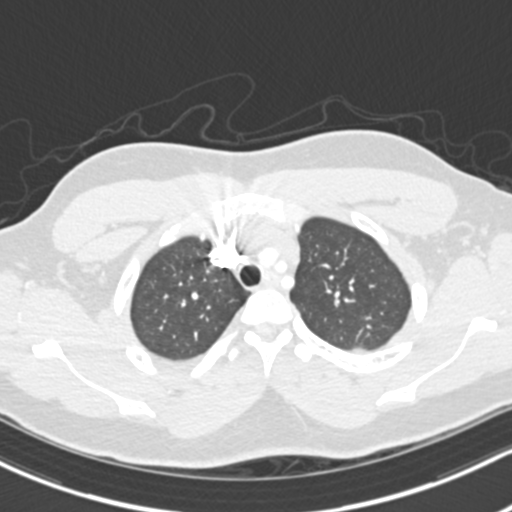
[im 284/338  mediastinal]
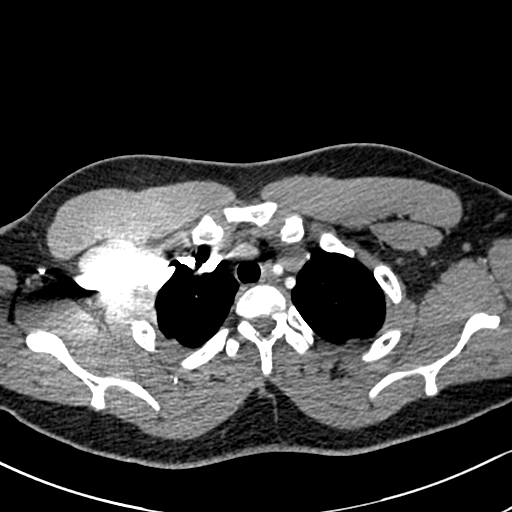
[im 302/338  lung]
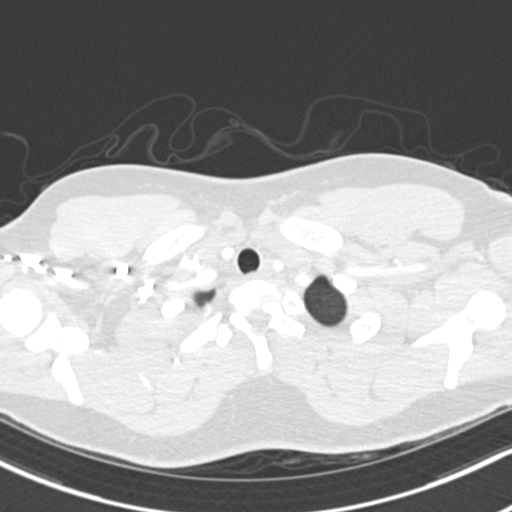
[im 320/338  mediastinal]
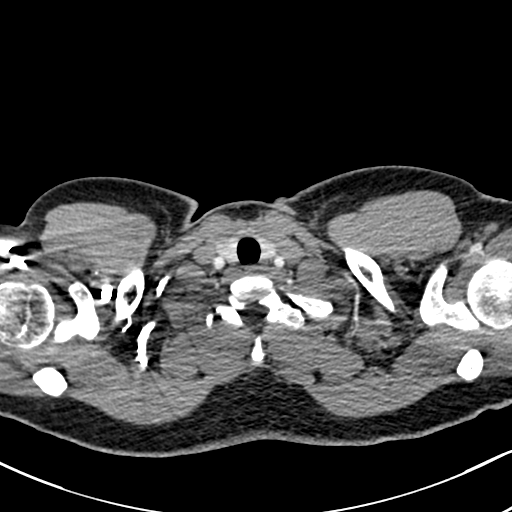

[Series 7: pe coronal mpr · coronal · 0.66mm/px · 1 of 136 slices shown]
[im 68/136  mediastinal]
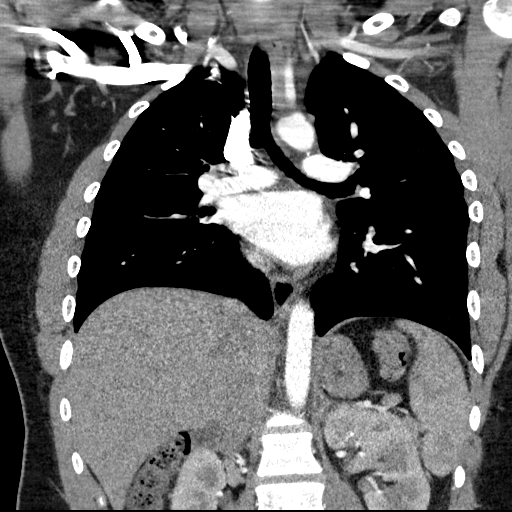

[19 of 36 positions shown; findings below may reference images not displayed]

RADIATION DOSE REDUCTION: This exam was performed according to the
departmental dose-optimization program which includes automated
exposure control, adjustment of the mA and/or kV according to
patient size and/or use of iterative reconstruction technique.

CONTRAST:  75mL OMNIPAQUE IOHEXOL 350 MG/ML SOLN
FINDINGS: Cardiovascular: There are no filling defects within the pulmonary
arteries to suggest pulmonary embolus. Upper normal caliber
pulmonary artery at 3 cm. Normal thoracic aorta without dissection
or acute aortic findings. Conventional branching pattern from the
aortic arch. The heart is normal in size. There is no pericardial
effusion.

Mediastinum/Nodes: Shotty right hilar lymph node measuring 10 mm
short axis. No mediastinal adenopathy. Triangular soft tissue
density in the anterior mediastinum is typical of residual thymus,
not unexpected for age. No esophageal wall thickening. No thyroid
nodule.

Lungs/Pleura: Clear lungs. No focal airspace disease. No pleural
effusion. No features of pulmonary edema. Trachea and central
bronchi are patent.

Upper Abdomen: No acute or unexpected findings.

Musculoskeletal: There are no acute or suspicious osseous
abnormalities. Bilateral gynecomastia.

Review of the MIP images confirms the above findings.
IMPRESSION: 1. No pulmonary embolus or acute intrathoracic abnormality.
2. Shotty right hilar lymph node, likely reactive.
3. Bilateral gynecomastia.

## 2023-06-28 ENCOUNTER — Other Ambulatory Visit: Payer: Self-pay | Admitting: Hematology & Oncology

## 2023-06-28 DIAGNOSIS — I824Y9 Acute embolism and thrombosis of unspecified deep veins of unspecified proximal lower extremity: Secondary | ICD-10-CM

## 2023-06-28 DIAGNOSIS — D6859 Other primary thrombophilia: Secondary | ICD-10-CM

## 2023-07-30 DIAGNOSIS — F900 Attention-deficit hyperactivity disorder, predominantly inattentive type: Secondary | ICD-10-CM | POA: Diagnosis not present

## 2023-07-30 DIAGNOSIS — F411 Generalized anxiety disorder: Secondary | ICD-10-CM | POA: Diagnosis not present

## 2023-07-30 DIAGNOSIS — Z79899 Other long term (current) drug therapy: Secondary | ICD-10-CM | POA: Diagnosis not present

## 2023-07-30 DIAGNOSIS — F909 Attention-deficit hyperactivity disorder, unspecified type: Secondary | ICD-10-CM | POA: Diagnosis not present

## 2023-08-01 DIAGNOSIS — Z114 Encounter for screening for human immunodeficiency virus [HIV]: Secondary | ICD-10-CM | POA: Diagnosis not present

## 2023-08-01 DIAGNOSIS — Z113 Encounter for screening for infections with a predominantly sexual mode of transmission: Secondary | ICD-10-CM | POA: Diagnosis not present

## 2023-08-19 ENCOUNTER — Other Ambulatory Visit: Payer: Self-pay | Admitting: Hematology & Oncology

## 2023-08-19 DIAGNOSIS — D6859 Other primary thrombophilia: Secondary | ICD-10-CM

## 2023-08-19 DIAGNOSIS — I824Y9 Acute embolism and thrombosis of unspecified deep veins of unspecified proximal lower extremity: Secondary | ICD-10-CM

## 2023-10-05 ENCOUNTER — Other Ambulatory Visit: Payer: Self-pay | Admitting: Hematology & Oncology

## 2023-10-05 DIAGNOSIS — I824Y9 Acute embolism and thrombosis of unspecified deep veins of unspecified proximal lower extremity: Secondary | ICD-10-CM

## 2023-10-05 DIAGNOSIS — D6859 Other primary thrombophilia: Secondary | ICD-10-CM

## 2023-10-17 DIAGNOSIS — Z23 Encounter for immunization: Secondary | ICD-10-CM | POA: Diagnosis not present

## 2023-10-29 DIAGNOSIS — Z6829 Body mass index (BMI) 29.0-29.9, adult: Secondary | ICD-10-CM | POA: Diagnosis not present

## 2023-10-29 DIAGNOSIS — Z0000 Encounter for general adult medical examination without abnormal findings: Secondary | ICD-10-CM | POA: Diagnosis not present

## 2023-10-29 DIAGNOSIS — R41840 Attention and concentration deficit: Secondary | ICD-10-CM | POA: Diagnosis not present

## 2023-10-29 DIAGNOSIS — D6851 Activated protein C resistance: Secondary | ICD-10-CM | POA: Diagnosis not present

## 2023-10-29 DIAGNOSIS — Z1329 Encounter for screening for other suspected endocrine disorder: Secondary | ICD-10-CM | POA: Diagnosis not present

## 2023-11-20 DIAGNOSIS — F909 Attention-deficit hyperactivity disorder, unspecified type: Secondary | ICD-10-CM | POA: Diagnosis not present

## 2023-11-20 DIAGNOSIS — F411 Generalized anxiety disorder: Secondary | ICD-10-CM | POA: Diagnosis not present
